# Patient Record
Sex: Male | Born: 1955 | Race: White | Hispanic: No | Marital: Married | Smoking: Former smoker
Health system: Southern US, Community
[De-identification: ages and names within clinical notes are randomized; demographics above are authoritative.]

## PROBLEM LIST (undated history)

## (undated) DIAGNOSIS — E785 Hyperlipidemia, unspecified: Secondary | ICD-10-CM

## (undated) DIAGNOSIS — M1990 Unspecified osteoarthritis, unspecified site: Secondary | ICD-10-CM

## (undated) DIAGNOSIS — D1391 Familial adenomatous polyposis: Secondary | ICD-10-CM

## (undated) DIAGNOSIS — G4730 Sleep apnea, unspecified: Secondary | ICD-10-CM

## (undated) DIAGNOSIS — D126 Benign neoplasm of colon, unspecified: Secondary | ICD-10-CM

## (undated) DIAGNOSIS — I5042 Chronic combined systolic (congestive) and diastolic (congestive) heart failure: Secondary | ICD-10-CM

## (undated) DIAGNOSIS — I480 Paroxysmal atrial fibrillation: Secondary | ICD-10-CM

## (undated) DIAGNOSIS — Z6841 Body Mass Index (BMI) 40.0 and over, adult: Secondary | ICD-10-CM

## (undated) DIAGNOSIS — D689 Coagulation defect, unspecified: Secondary | ICD-10-CM

## (undated) DIAGNOSIS — T7840XA Allergy, unspecified, initial encounter: Secondary | ICD-10-CM

## (undated) DIAGNOSIS — G4733 Obstructive sleep apnea (adult) (pediatric): Secondary | ICD-10-CM

## (undated) DIAGNOSIS — I499 Cardiac arrhythmia, unspecified: Secondary | ICD-10-CM

## (undated) DIAGNOSIS — I10 Essential (primary) hypertension: Secondary | ICD-10-CM

## (undated) HISTORY — PX: TOOTH EXTRACTION: SUR596

## (undated) HISTORY — DX: Benign neoplasm of colon, unspecified: D12.6

## (undated) HISTORY — PX: VASECTOMY: SHX75

## (undated) HISTORY — DX: Coagulation defect, unspecified: D68.9

## (undated) HISTORY — DX: Essential (primary) hypertension: I10

## (undated) HISTORY — DX: Unspecified osteoarthritis, unspecified site: M19.90

## (undated) HISTORY — DX: Chronic combined systolic (congestive) and diastolic (congestive) heart failure: I50.42

## (undated) HISTORY — PX: COLON SURGERY: SHX602

## (undated) HISTORY — PX: ROTATOR CUFF REPAIR: SHX139

## (undated) HISTORY — DX: Sleep apnea, unspecified: G47.30

## (undated) HISTORY — DX: Cardiac arrhythmia, unspecified: I49.9

## (undated) HISTORY — DX: Paroxysmal atrial fibrillation: I48.0

## (undated) HISTORY — DX: Allergy, unspecified, initial encounter: T78.40XA

## (undated) HISTORY — DX: Obstructive sleep apnea (adult) (pediatric): G47.33

## (undated) HISTORY — DX: Familial adenomatous polyposis: D13.91

---

## 2000-12-10 ENCOUNTER — Emergency Department (HOSPITAL_COMMUNITY): Admission: EM | Admit: 2000-12-10 | Discharge: 2000-12-10 | Payer: Self-pay | Admitting: Emergency Medicine

## 2000-12-10 ENCOUNTER — Encounter: Payer: Self-pay | Admitting: Emergency Medicine

## 2004-04-15 DIAGNOSIS — I10 Essential (primary) hypertension: Secondary | ICD-10-CM | POA: Insufficient documentation

## 2015-04-16 HISTORY — PX: CHEST TUBE INSERTION: SHX231

## 2015-07-11 DIAGNOSIS — E785 Hyperlipidemia, unspecified: Secondary | ICD-10-CM | POA: Insufficient documentation

## 2017-10-13 HISTORY — PX: COLONOSCOPY: SHX174

## 2018-02-13 ENCOUNTER — Ambulatory Visit (INDEPENDENT_AMBULATORY_CARE_PROVIDER_SITE_OTHER): Payer: BLUE CROSS/BLUE SHIELD | Admitting: Family Medicine

## 2018-02-13 ENCOUNTER — Encounter: Payer: Self-pay | Admitting: Family Medicine

## 2018-02-13 DIAGNOSIS — Z87892 Personal history of anaphylaxis: Secondary | ICD-10-CM | POA: Diagnosis not present

## 2018-02-13 DIAGNOSIS — I10 Essential (primary) hypertension: Secondary | ICD-10-CM

## 2018-02-13 DIAGNOSIS — M25569 Pain in unspecified knee: Secondary | ICD-10-CM | POA: Diagnosis not present

## 2018-02-13 DIAGNOSIS — E785 Hyperlipidemia, unspecified: Secondary | ICD-10-CM

## 2018-02-13 DIAGNOSIS — D126 Benign neoplasm of colon, unspecified: Secondary | ICD-10-CM | POA: Diagnosis not present

## 2018-02-13 MED ORDER — EPINEPHRINE 0.3 MG/0.3ML IJ SOAJ: 0.3000 mg | Freq: Once | INTRAMUSCULAR | 0 refills | Status: DC | PRN

## 2018-02-13 MED ORDER — LISINOPRIL 40 MG PO TABS: 40.0000 mg | ORAL_TABLET | Freq: Every day | ORAL | 1 refills | Status: DC

## 2018-02-13 MED ORDER — TRAMADOL HCL 50 MG PO TABS: 50.0000 mg | ORAL_TABLET | Freq: Four times a day (QID) | ORAL | 0 refills | Status: DC | PRN

## 2018-02-13 NOTE — Assessment & Plan Note (Signed)
BMI 44.3 today.  Discussed importance of losing weight and helping with his knee pain/arthritis as well as hypertension.  Patient voiced understanding.  Follow-up in 6 months.

## 2018-02-13 NOTE — Assessment & Plan Note (Signed)
Obtain records from previous PCP.  Reportedly not due for next colonoscopy for 5 years.

## 2018-02-13 NOTE — Assessment & Plan Note (Signed)
At goal.  Refilled lisinopril 40 mg daily.

## 2018-02-13 NOTE — Patient Instructions (Signed)
It was very nice to see you today!  I will refill your medications and send an EpiPen for you  You probably have mild arthritis in your knee.  You can take over-the-counter medication such as Tylenol, ibuprofen, and naproxen as needed.  Please continue using your compression sleeve.  You can also use ice to the area.  Come back to see me in about 6 months for your annual physical with blood work, or sooner as needed.  Take care, Dr Jerline Pain

## 2018-02-13 NOTE — Progress Notes (Signed)
Subjective:  Clayton Freeman is a 62 y.o. male who presents today with a chief complaint of HTN and to establish care.  HPI:  HTN Several year history. Stable. Currently on lisinopril 40mg  daily and tolerating well. No reported chest pain or shortness of breath.   Knee Pain Several month history. Located in right knee however is present in both knees.  Symptoms are stable.  Worse with certain activities.  He has tried compression brace which helps some.  Occasionally takes Tylenol and other over-the-counter's which helped.  Very infrequently uses tramadol for severe pain.  FAP Last had a colonoscopy few months ago.  Reportedly was cleared for 5 years.  No reported hematochezia or melena.  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  ROS: Per HPI, otherwise a complete review of systems was negative.   PMH:  The following were reviewed and entered/updated in epic: Past Medical History:  Diagnosis Date  . FAP (familial adenomatous polyposis)   . Hypertension    Patient Active Problem List   Diagnosis Date Noted  . Morbid obesity ([place]) 02/13/2018  . History of anaphylaxis 02/13/2018  . Knee pain 02/13/2018  . FAP (familial adenomatous polyposis)   . Dyslipidemia 07/11/2015  . Essential (primary) hypertension 04/15/2004   Past Surgical History:  Procedure Laterality Date  . ROTATOR CUFF REPAIR     Jan 2019    Family History  Problem Relation Age of Onset  . Hypertension Mother   . Colonic polyp Father   . Diverticulitis Father   . Hypertension Father   . Prostate cancer Father     Medications- reviewed and updated Current Outpatient Medications  Medication Sig Dispense Refill  . aspirin EC 81 MG tablet Take 81 mg by mouth daily.    Marland Kitchen lisinopril (PRINIVIL,ZESTRIL) 40 MG tablet Take 1 tablet (40 mg total) by mouth daily. 90 tablet 1  . Multiple Vitamin (MULTIVITAMIN) tablet Take 1 tablet by mouth daily.    . Omega-3 Fatty Acids (FISH OIL) 1000 MG  CAPS Take by mouth.    . traMADol (ULTRAM) 50 MG tablet Take 1 tablet (50 mg total) by mouth every 6 (six) hours as needed for moderate pain or severe pain. 1-2 every 4 hours as needed for pain 30 tablet 0  . VITAMIN E PO Take by mouth.    . EPINEPHrine 0.3 mg/0.3 mL IJ SOAJ injection Inject 0.3 mLs (0.3 mg total) into the muscle once as needed (anaphylaxis/allergic reaction). 2 Device 0   No current facility-administered medications for this visit.    Allergies-reviewed and updated Allergies  Allergen Reactions  . Bee Venom Anaphylaxis, Hives, Itching, Palpitations, Rash, Shortness Of Breath and Swelling  . Penicillins Anaphylaxis, Hives, Itching, Palpitations, Rash, Shortness Of Breath and Swelling  . Other     Horse Radish  . Erythromycin Hives, Rash and Swelling    Social History   Socioeconomic History  . Marital status: Married    Spouse name: Not on file  . Number of children: Not on file  . Years of education: Not on file  . Highest education level: Not on file  Occupational History  . Not on file  Social Needs  . Financial resource strain: Not on file  . Food insecurity:    Worry: Not on file    Inability: Not on file  . Transportation needs:    Medical: Not on file    Non-medical: Not on file  Tobacco Use  . Smoking status: Former  Smoker  . Smokeless tobacco: Never Used  . Tobacco comment: occ smoke pipe for 2 years  Substance and Sexual Activity  . Alcohol use: Yes    Comment: Very Rarely  . Drug use: Never  . Sexual activity: Not on file  Lifestyle  . Physical activity:    Days per week: Not on file    Minutes per session: Not on file  . Stress: Not on file  Relationships  . Social connections:    Talks on phone: Not on file    Gets together: Not on file    Attends religious service: Not on file    Active member of club or organization: Not on file    Attends meetings of clubs or organizations: Not on file    Relationship status: Not on file  Other  Topics Concern  . Not on file  Social History Narrative  . Not on file    Objective:  Physical Exam: BP 120/78 (BP Location: Left Arm, Patient Position: Sitting, Cuff Size: Large)   Pulse 78   Temp 98 F (36.7 C) (Oral)   Ht 6' (1.829 m)   Wt (!) 326 lb 12.8 oz (148.2 kg)   SpO2 96%   BMI 44.32 kg/m   Gen: NAD, resting comfortably CV: RRR with no murmurs appreciated Pulm: NWOB, CTAB with no crackles, wheezes, or rhonchi GI: Normal bowel sounds present. Soft, Nontender, Nondistended. MSK: -Right knee: Mild crepitus with active range of motion.  Stable to varus and valgus stress.  Posterior and anterior drawer signs negative. -Left knee: Mild crepitus with active range of motion.  Stable to varus and valgus stress.  Posterior and anterior drawer signs negative. Skin: Warm, dry Neuro: Grossly normal, moves all extremities Psych: Normal affect and thought content  Assessment/Plan:  Morbid obesity ([facility]) BMI 44.3 today.  Discussed importance of losing weight and helping with his knee pain/arthritis as well as hypertension.  Patient voiced understanding.  Follow-up in 6 months.  History of anaphylaxis EpiPen refilled.  Knee pain Consistent with osteoarthritis.  Recommended compression sleeve, ice, and over-the-counter analgesics as needed.  Database was reviewed without red flags.  Refilled his tramadol today as well.  Will obtain records from his previous PCP.  FAP (familial adenomatous polyposis) Obtain records from previous PCP.  Reportedly not due for next colonoscopy for 5 years.  Essential (primary) hypertension At goal.  Refilled lisinopril 40 mg daily.  Dyslipidemia Obtain records from previous PCP.  Check lipid panel with next blood draw.  Preventative healthcare Obtain records from previous PCP.  Reportedly up-to-date on vaccines and screenings.  Algis Greenhouse. Jerline Pain, MD 02/13/2018 12:34 PM

## 2018-02-13 NOTE — Assessment & Plan Note (Signed)
EpiPen refilled 

## 2018-02-13 NOTE — Assessment & Plan Note (Signed)
Consistent with osteoarthritis.  Recommended compression sleeve, ice, and over-the-counter analgesics as needed.  Database was reviewed without red flags.  Refilled his tramadol today as well.  Will obtain records from his previous PCP.

## 2018-02-13 NOTE — Assessment & Plan Note (Addendum)
Obtain records from previous PCP.  Check lipid panel with next blood draw.

## 2018-04-07 ENCOUNTER — Telehealth: Payer: BLUE CROSS/BLUE SHIELD | Admitting: Physician Assistant

## 2018-04-07 DIAGNOSIS — J00 Acute nasopharyngitis [common cold]: Secondary | ICD-10-CM

## 2018-04-07 MED ORDER — BENZONATATE 100 MG PO CAPS: 100.0000 mg | ORAL_CAPSULE | Freq: Three times a day (TID) | ORAL | 0 refills | Status: DC

## 2018-04-07 MED ORDER — IPRATROPIUM BROMIDE 0.06 % NA SOLN: 2.0000 | Freq: Four times a day (QID) | NASAL | 0 refills | Status: DC

## 2018-04-07 NOTE — Progress Notes (Signed)
We are sorry you are not feeling well.  Here is how we plan to help!  Based on what you have shared with me, it looks like you may have a viral upper respiratory infection or a "common cold".  Colds are caused by a large number of viruses; however, rhinovirus is the most common cause.   Symptoms of the common cold vary from person to person, with common symptoms including sore throat, cough, and malaise.  A low-grade fever of 100.4 may present, but is often uncommon.  Symptoms vary however, and are closely related to a person's age or underlying illnesses.  The most common symptoms associated with the common cold are nasal discharge or congestion, cough, sneezing, headache and pressure in the ears and face.  Cold symptoms usually persist for about 3 to 10 days, but can last up to 2 weeks.  It is important to know that colds do not cause serious illness or complications in most cases.    The common cold is transmitted from person to person, with the most common method of transmission being a person's hands.  The virus is able to live on the skin and can infect other persons for up to 2 hours after direct contact.  Also, colds are transmitted when someone coughs or sneezes; thus, it is important to cover the mouth to reduce this risk.  To keep the spread of the common cold at [place], good hand hygiene is very important.  This is an infection that is most likely caused by a virus. There are no specific treatments for the common cold other than to help you with the symptoms until the infection runs its course.    For nasal congestion, you may use an oral decongestants such as Mucinex D or if you have glaucoma or high blood pressure use plain Mucinex.  Saline nasal spray or nasal drops can help and can safely be used as often as needed for congestion.  For your congestion, I have prescribed Ipratropium Bromide nasal spray 0.03% two sprays in each nostril 2-3 times a day  If you do not have a history of heart  disease, hypertension, diabetes or thyroid disease, prostate/bladder issues or glaucoma, you may also use Sudafed to treat nasal congestion.  It is highly recommended that you consult with a pharmacist or your primary care physician to ensure this medication is safe for you to take.     If you have a cough, you may use cough suppressants such as Delsym and Robitussin.  If you have glaucoma or high blood pressure, you can also use Coricidin HBP.   For cough I have prescribed for you A prescription cough medication called Tessalon Perles 100 mg. You may take 1-2 capsules every 8 hours as needed for cough  If you have a sore or scratchy throat, use a saltwater gargle-  to  teaspoon of salt dissolved in a 4-ounce to 8-ounce glass of warm water.  Gargle the solution for approximately 15-30 seconds and then spit.  It is important not to swallow the solution.  You can also use throat lozenges/cough drops and Chloraseptic spray to help with throat pain or discomfort.  Warm or cold liquids can also be helpful in relieving throat pain.  For headache, pain or general discomfort, you can use Ibuprofen or Tylenol as directed.   Some authorities believe that zinc sprays or the use of Echinacea may shorten the course of your symptoms.   HOME CARE Only take medications as instructed  by your medical team. Be sure to drink plenty of fluids. Water is fine as well as fruit juices, sodas and electrolyte beverages. You may want to stay away from caffeine or alcohol. If you are nauseated, try taking small sips of liquids. How do you know if you are getting enough fluid? Your urine should be a pale yellow or almost colorless. Get rest. Taking a steamy shower or using a humidifier may help nasal congestion and ease sore throat pain. You can place a towel over your head and breathe in the steam from hot water coming from a faucet. Using a saline nasal spray works much the same way. Cough drops, hard candies and sore throat  lozenges may ease your cough. Avoid close contacts especially the very young and the elderly Cover your mouth if you cough or sneeze Always remember to wash your hands.   GET HELP RIGHT AWAY IF: You develop worsening fever. If your symptoms do not improve within 10 days You develop yellow or green discharge from your nose over 3 days. You have coughing fits You develop a severe head ache or visual changes. You develop shortness of breath, difficulty breathing or start having chest pain  Your symptoms persist after you have completed your treatment plan  MAKE SURE YOU  Understand these instructions. Will watch your condition. Will get help right away if you are not doing well or get worse.  Your e-visit answers were reviewed by a board certified advanced clinical practitioner to complete your personal care plan. Depending upon the condition, your plan could have included both over the counter or prescription medications. Please review your pharmacy choice. If there is a problem, you may call our nursing hot line at and have the prescription routed to another pharmacy. Your safety is important to [place]. If you have drug allergies check your prescription carefully.   You can use MyChart to ask questions about today's visit, request a non-urgent call back, or ask for a work or school excuse for 24 hours related to this e-Visit. If it has been greater than 24 hours you will need to follow up with your provider, or enter a new e-Visit to address those concerns. You will get an e-mail in the next two days asking about your experience.  I hope that your e-visit has been valuable and will speed your recovery. Thank you for using e-visits.      ===View-only below this line===   ----- Message -----    From: Carmin Richmond    Sent: 04/07/2018 12:00 PM EST      To: E-Visit Mailing List Subject: E-Visit Submission: Sinus Problems  E-Visit Submission: Sinus  Problems --------------------------------  Question: Which of the following have you been experiencing? Answer:   Congested nose            Headache            Throat pain            Cough  Question: Have these symptoms significantly worsened over the last two to three days? Answer:   Yes  Question: Describe your sore throat: Answer:   I usually start with bad sinus infection that then goes to throat and chest causing cough..  which is happening, usually does once or twice a year..The sinus are just irritating the throat  Question: How long have you had a sore throat? Answer:   1 day  Question: Do you have any tenderness or swelling in your neck? Answer:  No  Question: Have you had any of the following? Answer:   None of the above  Question: How long have you been having these symptoms? Answer:   3 days  Question: Do you have a fever? Answer:   I do not know  Question: Do you smoke? Answer:   No  Question: Have you ever smoked? Answer:   I smoked in the past, but not regularly  Question: Do you have any chronic illnesses, such as diabetes, heart disease, kidney disease, or lung disease, or any illness that would weaken your body's ability to fight infection? Answer:   No  Question: When you blow your nose, what color is the mucus? Answer:   Mostly thick and yellow or green  Question: Have you experienced similar problems in the past? Answer:   Yes  Question: What treatments have worked in the past?  Answer:   Usually my previous Dr.s (before moving to [place]) for similar cold / flu / bronc inf...  would prescribe             Keflex?, cephalexin?, Ceclor,  or last couple of years 2018  it was Levofloxacin  ~ 500 MG  Question: What treatment(s) in the past have been unsuccessful? Answer:   Noted in previous question            Keflex?,  keflexian?, Ceclor?,  or last couple of years 2018  it was Levofloxacin  ~ 500 MG (that was after my third visit with same symptoms in a  month...  Question: Is this illness similar to previous illnesses you have had?  How is it the same?  How is it different? Answer:   yes .. happens about yearly.. sometimes turns into flu, other times in respiratory system bronchitis or worse  Question: Have you recently been hospitalized? Answer:   No  Question: What medications are you currently taking for these symptoms? Answer:   Decongestants            Nose spray  Question: Please enter the names of any medications you are taking, or any other treatments you are trying. Answer:   zicam, vicks sinex severe... Coricidin HBP  Question: Please list your medication allergies that you may have ? (If 'none' , please list as 'none') Answer:   penicillin, erythromycin  Question: Please list any additional comments  Answer:

## 2018-04-27 ENCOUNTER — Other Ambulatory Visit: Payer: Self-pay | Admitting: Family Medicine

## 2018-04-28 NOTE — Telephone Encounter (Signed)
Please advise 

## 2018-05-15 ENCOUNTER — Ambulatory Visit: Payer: BLUE CROSS/BLUE SHIELD | Admitting: Family Medicine

## 2018-05-15 ENCOUNTER — Other Ambulatory Visit: Payer: Self-pay | Admitting: Family Medicine

## 2018-05-15 ENCOUNTER — Encounter: Payer: Self-pay | Admitting: Family Medicine

## 2018-05-15 VITALS — BP 124/76 | HR 92 | Temp 97.8°F | Ht 72.0 in | Wt 337.2 lb

## 2018-05-15 DIAGNOSIS — M25561 Pain in right knee: Secondary | ICD-10-CM | POA: Diagnosis not present

## 2018-05-15 DIAGNOSIS — G8929 Other chronic pain: Secondary | ICD-10-CM

## 2018-05-15 DIAGNOSIS — M25551 Pain in right hip: Secondary | ICD-10-CM | POA: Diagnosis not present

## 2018-05-15 MED ORDER — TRAMADOL HCL 50 MG PO TABS: 50.0000 mg | ORAL_TABLET | Freq: Four times a day (QID) | ORAL | 0 refills | Status: DC | PRN

## 2018-05-15 MED ORDER — PREDNISONE 50 MG PO TABS: ORAL_TABLET | ORAL | 0 refills | Status: DC

## 2018-05-15 NOTE — Progress Notes (Signed)
   Chief Complaint:  Clayton Freeman is a 63 y.o. male who presents for same day appointment with a chief complaint of knee pain.   Assessment/Plan:  Right knee pain Likely secondary to osteoarthritis.  He has an acute flare.  Will defer intra-articular injection today.  We will refill his tramadol.  Given his concurrent hip pain, we will start prednisone taper today.  Would avoid NSAIDs for the time being as we did not have a renal function test on file and he is currently on lisinopril.  Continue ice and compression.  Discussed reasons to return to care.  Will place referral to sports medicine.  Right hip pain Likely muscular strain in setting of right knee pain.  Will be treated with above prednisone.  Discussed home exercise program and handout was given.  Will place referral to sports medicine.  Discussed reasons to return to care.     Subjective:  HPI:  Knee Pain, acute problem Patient has history of chronic right knee pain.  Over the last 2 weeks he has noticed worsening pain and swelling to his right knee.  These symptoms have occurred gradually.  No obvious precipitating events.  No obvious injuries.  Occasionally feels a little instability in the joint as well.  He has additionally noticed some pain in his posterior right hip over the last couple of weeks as well.  He has tried tramadol which helps a little bit however symptoms seem to be progressing.  No weakness or numbness.  Pain is worse with most movements and with most positions.  It is very hard for him to get comfortable.  No other obvious alleviating or aggravating factors.   ROS: Per HPI  PMH: He reports that he has quit smoking. He has never used smokeless tobacco. He reports current alcohol use. He reports that he does not use drugs.      Objective:  Physical Exam: BP 124/76 (BP Location: Left Arm, Patient Position: Sitting, Cuff Size: Large)   Pulse 92   Temp 97.8 F (36.6 C) (Oral)   Ht 6' (1.829 m)   Wt (!) 337  lb 3.2 oz (153 kg)   SpO2 94%   BMI 45.73 kg/m   Gen: NAD, resting comfortably MSK -Right knee: Mild effusion noted.  Tender to palpation along joint line.  Anterior and posterior drawer signs negative.  McMurray negative.  Neurovascular intact distally. -Right hip: No deformities.  Tender palpation along posterior greater trochanter.  Tenderness elicited with resisted hip abduction and extension.     Algis Greenhouse. Jerline Pain, MD 05/15/2018 4:17 PM

## 2018-05-15 NOTE — Patient Instructions (Signed)
It was very nice to see you today!  Please start the prednisone.  I will refill your tramadol.  Please continue to use ice and compression to the areas.  Please come back to see Dr. Paulla Fore soon.  Take care, Dr Jerline Pain

## 2018-05-25 ENCOUNTER — Ambulatory Visit: Payer: Self-pay

## 2018-05-25 ENCOUNTER — Ambulatory Visit: Payer: BLUE CROSS/BLUE SHIELD | Admitting: Sports Medicine

## 2018-05-25 ENCOUNTER — Encounter: Payer: Self-pay | Admitting: Sports Medicine

## 2018-05-25 VITALS — BP 124/80 | HR 102 | Ht 72.0 in | Wt 336.0 lb

## 2018-05-25 DIAGNOSIS — M25551 Pain in right hip: Secondary | ICD-10-CM

## 2018-05-25 DIAGNOSIS — G8929 Other chronic pain: Secondary | ICD-10-CM

## 2018-05-25 DIAGNOSIS — M25561 Pain in right knee: Secondary | ICD-10-CM

## 2018-05-25 NOTE — Patient Instructions (Addendum)

## 2018-05-25 NOTE — Progress Notes (Signed)
Clayton Freeman. Clayton Freeman, [facility] - 63 y.o. male MRN [id number]  Date of birth: 1955-04-21  Visit Date: May 25, 2018  PCP: Vivi Barrack, MD   Referred by: Vivi Barrack, MD  SUBJECTIVE:  Chief Complaint  Patient presents with  . Right Hip - Initial Assessment  . Right Knee - Initial Assessment  . New Patient (Initial Visit)    R hip and knee pain.  Prednisone 50mg .    HPI: Patient is here for evaluation of right hip and knee pain that is been present for the past year after having a fall [place] a step.  He landed directly on his right knee at that time and has had progressive intermittent symptoms since then.  He does report occasional mechanical giving way.  It progressively worsened up to a 9 out of 10 pain.  He was having swelling numbness and weakness in the right lower extremity as well as pain that was radiating up into the thigh and into the gluteal region.  He was having pain that was waking him up at night due to this.  He was started on prednisone last week and tramadol with moderate improvement.  He has been using ice heat and a brace intermittently but is actually been able to discontinue the brace with the prednisone.  REVIEW OF SYSTEMS: Denies fevers, chills, recent weight gain or weight loss.  No night sweats.  Pt denies any change in bowel or bladder habits, muscle weakness, numbness or falls associated with this pain. Some lower extremity edema but this is mild. Otherwise 12 point review of systems performed and is negative    HISTORY:  Prior history reviewed and updated per electronic medical record.  Patient Active Problem List   Diagnosis Date Noted  . Morbid obesity ([place]) 02/13/2018  . History of anaphylaxis 02/13/2018  . Knee pain 02/13/2018  . FAP (familial adenomatous polyposis)   . Dyslipidemia 07/11/2015  . Essential  (primary) hypertension 04/15/2004   Social History   Occupational History  . Not on file  Tobacco Use  . Smoking status: Former Research scientist (life sciences)  . Smokeless tobacco: Never Used  . Tobacco comment: occ smoke pipe for 2 years  Substance and Sexual Activity  . Alcohol use: Yes    Comment: Very Rarely  . Drug use: Never  . Sexual activity: Not on file   Social History   Social History Narrative  . Not on file    Past Medical History:  Diagnosis Date  . FAP (familial adenomatous polyposis)   . Hypertension      Past Surgical History:  Procedure Laterality Date  . ROTATOR CUFF REPAIR     Jan 2019    family history includes Colonic polyp in his father; Diverticulitis in his father; Hypertension in his father and mother; Prostate cancer in his father. No results for input(s): HGBA1C, LABURIC, CREATININE, CALCIUM, MG, AST, ALT, CKTOTAL, TSH in the last 8760 hours.  OBJECTIVE:  VS:  HT:6' (182.9 cm)   WT:(!) 336 lb (152.4 kg)  BMI:45.56    BP:124/80  HR:(!) 102bpm  TEMP: ( )  RESP:94 %   PHYSICAL EXAM: Well-developed, Well-nourished and In no acute distress  Pupils are equal., EOM intact without nystagmus. and No scleral icterus.  Alert & appropriately interactive. and Not depressed or anxious appearing.   Warm and well perfused Pulses:  DP  Pulses: Bilaterally normal and symmetric PT Pulses: Bilaterally normal and symmetric Edema: No significant swelling or edema Calf supple with no pain with squeeze (Negative Homans)   Right knee: Overall well aligned.  He has a small supraphysiologic effusion.  He has slight laxity with anterior drawer testing bilaterally but this is symmetric.  Pain with McMurray's localizing to the medial joint line.  He is ligamentously stable to varus and valgus straining.  Extensor mechanism strength is intact.  He has minimal pain with Stinchfield testing.  Logroll test is negative.  Mild pain with terminal FADIR however this is within normal range.     ASSESSMENT:   1. Chronic pain of right knee   2. Pain of right hip joint     PROCEDURES:  US Guided Injection per procedure note    PROCEDURE NOTE: THERAPEUTIC EXERCISES (95188)   Discussed the foundation of treatment for this condition is physical therapy and/or daily (5-6 days/week) therapeutic exercises, focusing on core strengthening, coordination, neuromuscular control/reeducation. 15 minutes spent for Therapeutic exercises as below and as referenced in the AVS. This included exercises focusing on stretching, strengthening, with significant focus on eccentric aspects.  Proper technique shown and discussed handout in great detail with ATC. All questions were discussed and answered.   Long term goals include an improvement in range of motion, strength, endurance as well as avoiding reinjury. Frequency of visits is one time as determined during today's office visit. Frequency of exercises to be performed is as per handout.  EXERCISES REVIEWED: Hip ABduction strengthening with focus on Glute Medius Recruitment VMO Strengthening      PLAN:  Pertinent additional documentation may be included in corresponding procedure notes, imaging studies, problem based documentation and patient instructions.  No problem-specific Assessment & Plan notes found for this encounter.  We will go ahead and inject the knee per procedure note and have them begin on hip and knee strenghtening exersises. Additionally we discussed the merits of compression and/or bracing and recommend prophylatic compression with activity.  Icing discussed PRN.  If persistent ongoing symptoms can consider repeat injections and viscous supplementation.  Home Therapeutic exercises prescribed today per procedure note.  Activity modifications and the importance of avoiding exacerbating activities (limiting pain to no more than a 4 / 10 during or following activity) recommended and discussed.  Discussed red flag symptoms that  warrant earlier emergent evaluation and patient voices understanding.  Return in about 6 weeks (around 07/06/2018).          Gerda Diss, [facility] Physician

## 2018-05-25 NOTE — Procedures (Signed)
PROCEDURE NOTE:  Ultrasound Guided: Injection: Right knee Images were obtained and interpreted by myself, Teresa Coombs, DO  Images have been saved and stored to PACS system. Images obtained on: GE S7 Ultrasound machine    ULTRASOUND FINDINGS:  Degenerative bulging of the medial meniscus with a small supraphysiologic effusion and generalized synovitis.  DESCRIPTION OF PROCEDURE:  The patient's clinical condition is marked by substantial pain and/or significant functional disability. Other conservative therapy has not provided relief, is contraindicated, or not appropriate. There is a reasonable likelihood that injection will significantly improve the patient's pain and/or functional impairment.   After discussing the risks, benefits and expected outcomes of the injection and all questions were reviewed and answered, the patient wished to undergo the above named procedure.  Verbal consent was obtained.  The ultrasound was used to identify the target structure and adjacent neurovascular structures. The skin was then prepped in sterile fashion and the target structure was injected under direct visualization using sterile technique as below:  Single injection performed as below: PREP: Alcohol and Ethel Chloride APPROACH:superiolateral, single injection, 25g 1.5 in. INJECTATE: 2 cc 0.5% Marcaine and 2 cc 40mg /mL DepoMedrol ASPIRATE: None DRESSING: Band-Aid  Post procedural instructions including recommending icing and warning signs for infection were reviewed.    This procedure was well tolerated and there were no complications.   IMPRESSION: Succesful Ultrasound Guided: Injection

## 2018-06-22 DIAGNOSIS — S0501XA Injury of conjunctiva and corneal abrasion without foreign body, right eye, initial encounter: Secondary | ICD-10-CM | POA: Diagnosis not present

## 2018-07-08 ENCOUNTER — Ambulatory Visit: Payer: BLUE CROSS/BLUE SHIELD | Admitting: Sports Medicine

## 2018-08-09 ENCOUNTER — Other Ambulatory Visit: Payer: Self-pay | Admitting: Family Medicine

## 2018-09-23 ENCOUNTER — Encounter: Payer: Self-pay | Admitting: Sports Medicine

## 2018-09-29 ENCOUNTER — Encounter: Payer: Self-pay | Admitting: Family Medicine

## 2018-09-29 ENCOUNTER — Ambulatory Visit (INDEPENDENT_AMBULATORY_CARE_PROVIDER_SITE_OTHER): Payer: BC Managed Care – PPO | Admitting: Family Medicine

## 2018-09-29 ENCOUNTER — Other Ambulatory Visit: Payer: Self-pay

## 2018-09-29 DIAGNOSIS — M25561 Pain in right knee: Secondary | ICD-10-CM

## 2018-09-29 DIAGNOSIS — G8929 Other chronic pain: Secondary | ICD-10-CM

## 2018-09-29 NOTE — Patient Instructions (Addendum)
Voltaren gel 1% apply to the 2 times daily  Ice 20 minutes 2 times daily. Usually after activity and before bed. Vitamin D 2000 Iu daily  Turmeric 500mg  daily  Tart cherry extract 1200mg  at night  Consider compression sleeve  [place]   330pm on July 1st  July

## 2018-09-29 NOTE — Progress Notes (Signed)
Virtual Visit via Video Note  I connected with Clayton Freeman on 09/29/18 at  4:30 PM EDT by a video enabled telemedicine application and verified that I am speaking with the correct person using two identifiers.  Location: Patient: in home setting Provider: in office setting    I discussed the limitations of evaluation and management by telemedicine and the availability of in person appointments. The patient expressed understanding and agreed to proceed.  History of Present Illness: Patient is a pleasant 63 year old male who was seen by another provider in February and given an injection in the left knee.  Was doing significantly better but now the pain is starting to come back.  Mild instability has been using a brace for.  Patient states that at the end of a long day he does have more of a dull, throbbing aching sensation.  Patient has not been doing anything else on a regular basis at the moment.  Patient denies any falls or any new injuries    Observations/Objective: Appears alert and oriented.  Very pleasant to talk to over the virtual platform   Assessment and Plan: Left knee pain.  Per other providers there is a chance for osteoarthritic changes.  Discussed with patient I would like to see dyspnea and person in the next 2 weeks.  We discussed over-the-counter topical anti-inflammatories, vitamin D, as well as natural supplementations that I think will be helpful.  Encourage patient to use a compression sleeve on a daily basis.  Will follow-up with me in office in 2 to 3 weeks   Follow Up Instructions: 2 to 3 weeks in office with future considerations for formal physical therapy, steroid injections, and to discuss other treatment options and help slow down the progression of arthritis    I discussed the assessment and treatment plan with the patient. The patient was provided an opportunity to ask questions and all were answered. The patient agreed with the plan and demonstrated an  understanding of the instructions.   The patient was advised to call back or seek an in-person evaluation if the symptoms worsen or if the condition fails to improve as anticipated.  I provided 26 minutes of face-to-face time during this encounter.   Lyndal Pulley, DO

## 2018-10-13 ENCOUNTER — Ambulatory Visit: Payer: BC Managed Care – PPO | Admitting: Family Medicine

## 2018-10-13 ENCOUNTER — Encounter: Payer: Self-pay | Admitting: Family Medicine

## 2018-10-13 ENCOUNTER — Ambulatory Visit (INDEPENDENT_AMBULATORY_CARE_PROVIDER_SITE_OTHER)
Admission: RE | Admit: 2018-10-13 | Discharge: 2018-10-13 | Disposition: A | Discharge status: Home / Self Care | Payer: BC Managed Care – PPO | Source: Ambulatory Visit | Attending: Family Medicine | Admitting: Family Medicine

## 2018-10-13 ENCOUNTER — Other Ambulatory Visit: Payer: Self-pay

## 2018-10-13 VITALS — BP 124/82 | HR 87 | Ht 72.0 in

## 2018-10-13 DIAGNOSIS — G8929 Other chronic pain: Secondary | ICD-10-CM

## 2018-10-13 DIAGNOSIS — M1711 Unilateral primary osteoarthritis, right knee: Secondary | ICD-10-CM | POA: Insufficient documentation

## 2018-10-13 DIAGNOSIS — M25561 Pain in right knee: Secondary | ICD-10-CM

## 2018-10-13 DIAGNOSIS — M170 Bilateral primary osteoarthritis of knee: Secondary | ICD-10-CM | POA: Diagnosis not present

## 2018-10-13 NOTE — Assessment & Plan Note (Signed)
Patellofemoral arthritis.  True pull lite brace given.  Discussed icing regimen and home exercise.  Mild to moderate abnormal thigh to calf ratio and if the other brace does not work a custom brace will be necessary.  We discussed formal physical therapy and how that could be beneficial as well.  Patient would like to hold on that secondary to the coronavirus outbreak.  Discussed icing regimen, topical anti-inflammatories and vitamin supplementations.  X-rays pending.  Follow-up again 4 to 8 weeks

## 2018-10-13 NOTE — Patient Instructions (Signed)
Good to see you  Try the brace, if it does not work we will get a custom one done for you  Ice 20 minutes 2 times daily. Usually after activity and before bed. Exercises 3 times a week.  pennsaid pinkie amount topically 2 times daily as needed.  BUT try the voltaren gel 2 times daily  Continue the vitamins See me again in 4 weeks to make sure we are making progress.

## 2018-10-13 NOTE — Progress Notes (Signed)
[facility] [address], [place] [address] Phone: [phone] Subjective:   Clayton Freeman, am serving as a scribe for Dr. Hulan Saas.    CC: Right knee pain  NIO:[id number]  Clayton Freeman is a 63 y.o. male coming in with complaint of right knee pain. Seen virtually on 09/29/2018. Patient states he has had pain for 3 weeks. Had injection in February from Dr. Paulla Fore. He had relief for a few months. Pain is increasing due to doing more activities outside of the house.  Patient states that the pain seems to be more anterior to the knee.  Sometimes feels like the knee wants to hyperextend.  Denies any true swelling but sometimes feels like it looks bigger than the contralateral side.  Past Medical History:  Diagnosis Date  . FAP (familial adenomatous polyposis)   . Hypertension    Past Surgical History:  Procedure Laterality Date  . ROTATOR CUFF REPAIR     Jan 2019   Social History   Socioeconomic History  . Marital status: Married    Spouse name: Not on file  . Number of children: Not on file  . Years of education: Not on file  . Highest education level: Not on file  Occupational History  . Not on file  Social Needs  . Financial resource strain: Not on file  . Food insecurity    Worry: Not on file    Inability: Not on file  . Transportation needs    Medical: Not on file    Non-medical: Not on file  Tobacco Use  . Smoking status: Former Research scientist (life sciences)  . Smokeless tobacco: Never Used  . Tobacco comment: occ smoke pipe for 2 years  Substance and Sexual Activity  . Alcohol use: Yes    Comment: Very Rarely  . Drug use: Never  . Sexual activity: Not on file  Lifestyle  . Physical activity    Days per week: Not on file    Minutes per session: Not on file  . Stress: Not on file  Relationships  . Social Herbalist on phone: Not on file    Gets together: Not on file    Attends religious service: Not on file    Active member  of club or organization: Not on file    Attends meetings of clubs or organizations: Not on file    Relationship status: Not on file  Other Topics Concern  . Not on file  Social History Narrative  . Not on file   Allergies  Allergen Reactions  . Bee Venom Anaphylaxis, Hives, Itching, Palpitations, Rash, Shortness Of Breath and Swelling  . Penicillins Anaphylaxis, Hives, Itching, Palpitations, Rash, Shortness Of Breath and Swelling  . Other     Horse Radish  . Erythromycin Hives, Rash and Swelling   Family History  Problem Relation Age of Onset  . Hypertension Mother   . Colonic polyp Father   . Diverticulitis Father   . Hypertension Father   . Prostate cancer Father     Current Outpatient Medications (Endocrine & Metabolic):  .  predniSONE (DELTASONE) 50 MG tablet, Take 1 tablet daily for 5 days. Then take 1/2 tablet daily for 2 days.  Current Outpatient Medications (Cardiovascular):  Marland Kitchen  EPINEPHrine 0.3 mg/0.3 mL IJ SOAJ injection, Inject 0.3 mLs (0.3 mg total) into the muscle once as needed (anaphylaxis/allergic reaction). Marland Kitchen  lisinopril (ZESTRIL) 40 MG tablet, TAKE 1 TABLET BY MOUTH EVERY DAY  Current Outpatient Medications (Analgesics):  .  aspirin EC 81 MG tablet, Take 81 mg by mouth daily. .  traMADol (ULTRAM) 50 MG tablet, Take 1-2 tablets (50-100 mg total) by mouth every 6 (six) hours as needed.   Current Outpatient Medications (Other):  Marland Kitchen  Multiple Vitamin (MULTIVITAMIN) tablet, Take 1 tablet by mouth daily. .  Omega-3 Fatty Acids (FISH OIL) 1000 MG CAPS, Take by mouth. Marland Kitchen  VITAMIN E PO, Take by mouth.    Past medical history, social, surgical and family history all reviewed in electronic medical record.  Freeman pertanent information unless stated regarding to the chief complaint.   Review of Systems:  Freeman headache, visual changes, nausea, vomiting, diarrhea, constipation, dizziness, abdominal pain, skin rash, fevers, chills, night sweats, weight loss, swollen lymph  nodes, body aches,, chest pain, shortness of breath, mood changes.  Positive muscle aches, joint swelling  Objective  Blood pressure 124/82, pulse 87, height 6' (1.829 m), SpO2 97 %.   General: Freeman apparent distress alert and oriented x3 mood and affect normal, dressed appropriately.  HEENT: Pupils equal, extraocular movements intact  Respiratory: Patient's speak in full sentences and does not appear short of breath  Cardiovascular: Freeman lower extremity edema, non tender, Freeman erythema  Skin: Warm dry intact with Freeman signs of infection or rash on extremities or on axial skeleton.  Abdomen: Soft nontender  Neuro: Cranial nerves II through XII are intact, neurovascularly intact in all extremities with 2+ DTRs and 2+ pulses.  Lymph: Freeman lymphadenopathy of posterior or anterior cervical chain or axillae bilaterally.  Gait normal with good balance and coordination.  MSK:  Non tender with full range of motion and good stability and symmetric strength and tone of shoulders, elbows, wrist, hip, and ankles bilaterally.   Right knee exam does have some lateral translation of the patella noted at baseline.  Significant crepitus.  Positive apprehension test.  Patient does have a positive patella grind test.  Minimal tenderness to palpation over the medial lateral joint space.  Does have swelling of fat pad but does not seem irritated to palpation.  Contralateral knee unremarkable.   [address]; 15 additional minutes spent for Therapeutic exercises as stated in above notes.  This included exercises focusing on stretching, strengthening, with significant focus on eccentric aspects.   Long term goals include an improvement in range of motion, strength, endurance as well as avoiding reinjury. Patient's frequency would include in 1-2 times a day, 3-5 times a week for a duration of 6-12 weeks.Reviewed anatomy using anatomical model and how PFS occurs.  Given rehab exercises handout for VMO, hip abductors, core, entire kinetic  chain including proprioception exercises.  Could benefit from PT, regular exercise, upright biking, and a PFS knee brace to assist with tracking abnormalities.   Proper technique shown and discussed handout in great detail with ATC.  All questions were discussed and answered.     Impression and Recommendations:     This case required medical decision making of moderate complexity. The above documentation has been reviewed and is accurate and complete Lyndal Pulley, DO       Note: This dictation was prepared with Dragon dictation along with smaller phrase technology. Any transcriptional errors that result from this process are unintentional.

## 2018-10-14 ENCOUNTER — Ambulatory Visit: Payer: BC Managed Care – PPO | Admitting: Family Medicine

## 2018-11-11 ENCOUNTER — Encounter: Payer: Self-pay | Admitting: Family Medicine

## 2018-11-11 ENCOUNTER — Other Ambulatory Visit: Payer: Self-pay

## 2018-11-11 ENCOUNTER — Ambulatory Visit: Payer: BC Managed Care – PPO | Admitting: Family Medicine

## 2018-11-11 DIAGNOSIS — M1711 Unilateral primary osteoarthritis, right knee: Secondary | ICD-10-CM | POA: Diagnosis not present

## 2018-11-11 NOTE — Patient Instructions (Signed)
Continue with vitamins Wear brace with activity [phone] See me when you need me

## 2018-11-11 NOTE — Progress Notes (Signed)
[facility] [address] [place], [place] [address] Phone: [phone] Subjective:   Clayton Freeman, am serving as a scribe for Dr. Hulan Saas.   CC: Right knee pain follow-up  ZDG:[id number]   10/13/2018: Patellofemoral arthritis.  True pull lite brace given.  Discussed icing regimen and home exercise.  Mild to moderate abnormal thigh to calf ratio and if the other brace does not work a custom brace will be necessary.  We discussed formal physical therapy and how that could be beneficial as well.  Patient would like to hold on that secondary to the coronavirus outbreak.  Discussed icing regimen, topical anti-inflammatories and vitamin supplementations.  X-rays pending.  Follow-up again 4 to 8 weeks  Update 11/11/2018: Clayton Freeman is a 63 y.o. male coming in with complaint of right knee pain. Patient states that his knee pain is better. Pain is intermittent. If he does not take tart cherry at night he can notice an increase in his pain. Is wearing Tru-pull lite.  Patient would state 80% better overall.  Not wearing the brace on a regular basis anymore because he feels like he is making some improvement.    Past Medical History:  Diagnosis Date  . FAP (familial adenomatous polyposis)   . Hypertension    Past Surgical History:  Procedure Laterality Date  . ROTATOR CUFF REPAIR     Jan 2019   Social History   Socioeconomic History  . Marital status: Married    Spouse name: Not on file  . Number of children: Not on file  . Years of education: Not on file  . Highest education level: Not on file  Occupational History  . Not on file  Social Needs  . Financial resource strain: Not on file  . Food insecurity    Worry: Not on file    Inability: Not on file  . Transportation needs    Medical: Not on file    Non-medical: Not on file  Tobacco Use  . Smoking status: Former Research scientist (life sciences)  . Smokeless tobacco: Never Used  . Tobacco comment: occ smoke pipe  for 2 years  Substance and Sexual Activity  . Alcohol use: Yes    Comment: Very Rarely  . Drug use: Never  . Sexual activity: Not on file  Lifestyle  . Physical activity    Days per week: Not on file    Minutes per session: Not on file  . Stress: Not on file  Relationships  . Social Herbalist on phone: Not on file    Gets together: Not on file    Attends religious service: Not on file    Active member of club or organization: Not on file    Attends meetings of clubs or organizations: Not on file    Relationship status: Not on file  Other Topics Concern  . Not on file  Social History Narrative  . Not on file   Allergies  Allergen Reactions  . Bee Venom Anaphylaxis, Hives, Itching, Palpitations, Rash, Shortness Of Breath and Swelling  . Penicillins Anaphylaxis, Hives, Itching, Palpitations, Rash, Shortness Of Breath and Swelling  . Other     Horse Radish  . Erythromycin Hives, Rash and Swelling   Family History  Problem Relation Age of Onset  . Hypertension Mother   . Colonic polyp Father   . Diverticulitis Father   . Hypertension Father   . Prostate cancer Father     Current Outpatient  Medications (Endocrine & Metabolic):  .  predniSONE (DELTASONE) 50 MG tablet, Take 1 tablet daily for 5 days. Then take 1/2 tablet daily for 2 days.  Current Outpatient Medications (Cardiovascular):  Marland Kitchen  EPINEPHrine 0.3 mg/0.3 mL IJ SOAJ injection, Inject 0.3 mLs (0.3 mg total) into the muscle once as needed (anaphylaxis/allergic reaction). Marland Kitchen  lisinopril (ZESTRIL) 40 MG tablet, TAKE 1 TABLET BY MOUTH EVERY DAY   Current Outpatient Medications (Analgesics):  .  aspirin EC 81 MG tablet, Take 81 mg by mouth daily. .  traMADol (ULTRAM) 50 MG tablet, Take 1-2 tablets (50-100 mg total) by mouth every 6 (six) hours as needed.   Current Outpatient Medications (Other):  Marland Kitchen  Multiple Vitamin (MULTIVITAMIN) tablet, Take 1 tablet by mouth daily. .  Omega-3 Fatty Acids (FISH OIL) 1000  MG CAPS, Take by mouth. Marland Kitchen  VITAMIN E PO, Take by mouth.    Past medical history, social, surgical and family history all reviewed in electronic medical record.  Freeman pertanent information unless stated regarding to the chief complaint.   Review of Systems:  Freeman headache, visual changes, nausea, vomiting, diarrhea, constipation, dizziness, abdominal pain, skin rash, fevers, chills, night sweats, weight loss, swollen lymph nodes, body aches, joint swelling,  chest pain, shortness of breath, mood changes.  Mild positive muscle aches  Objective  Blood pressure 118/72, pulse 89, height 6' (1.829 m), weight (!) 336 lb (152.4 kg), SpO2 96 %.    General: Freeman apparent distress alert and oriented x3 mood and affect normal, dressed appropriately.  HEENT: Pupils equal, extraocular movements intact  Respiratory: Patient's speak in full sentences and does not appear short of breath  Cardiovascular: Freeman lower extremity edema, non tender, Freeman erythema  Skin: Warm dry intact with Freeman signs of infection or rash on extremities or on axial skeleton.  Abdomen: Soft nontender  Neuro: Cranial nerves II through XII are intact, neurovascularly intact in all extremities with 2+ DTRs and 2+ pulses.  Lymph: Freeman lymphadenopathy of posterior or anterior cervical chain or axillae bilaterally.  Gait normal with good balance and coordination.  MSK:  Non tender with full range of motion and good stability and symmetric strength and tone of shoulders, elbows, wrist, hip and ankles bilaterally.  Patient's right knee exam still has some crepitus noted.  Still some mild lateral tracking of the patella noted.  Some mild tenderness over the medial joint line.  Patient is doing better with range of motion and Freeman effusion noted.  Contralateral knee unremarkable.    Impression and Recommendations:     \ The above documentation has been reviewed and is accurate and complete Lyndal Pulley, DO       Note: This dictation was  prepared with Dragon dictation along with smaller phrase technology. Any transcriptional errors that result from this process are unintentional.

## 2018-11-11 NOTE — Assessment & Plan Note (Signed)
Patient is doing very well with conservative therapy at this time.  We will not have patient to increase activity.  Discussed icing regimen and home exercises.  Course of pain.  We discussed the importance of weight loss and decreasing the wear of the brace over the course of time.  Follow-up again in 4 to 8 weeks

## 2019-02-10 ENCOUNTER — Other Ambulatory Visit: Payer: Self-pay | Admitting: Family Medicine

## 2019-08-07 ENCOUNTER — Other Ambulatory Visit: Payer: Self-pay | Admitting: Family Medicine

## 2019-12-02 DIAGNOSIS — Z20822 Contact with and (suspected) exposure to covid-19: Secondary | ICD-10-CM | POA: Diagnosis not present

## 2019-12-14 DIAGNOSIS — Z20822 Contact with and (suspected) exposure to covid-19: Secondary | ICD-10-CM | POA: Diagnosis not present

## 2020-02-05 ENCOUNTER — Other Ambulatory Visit: Payer: Self-pay | Admitting: Family Medicine

## 2020-05-11 ENCOUNTER — Other Ambulatory Visit: Payer: Self-pay | Admitting: Family Medicine

## 2020-05-11 NOTE — Telephone Encounter (Signed)
Patient aware last OV 05/2018 Need OV for refills, will send refills for this month only till next appt

## 2020-05-26 ENCOUNTER — Ambulatory Visit: Payer: BC Managed Care – PPO | Admitting: Family Medicine

## 2020-05-26 ENCOUNTER — Encounter: Payer: Self-pay | Admitting: Family Medicine

## 2020-05-26 ENCOUNTER — Other Ambulatory Visit: Payer: Self-pay

## 2020-05-26 VITALS — BP 117/76 | HR 74 | Temp 98.2°F | Ht 72.0 in | Wt 333.6 lb

## 2020-05-26 DIAGNOSIS — Z87438 Personal history of other diseases of male genital organs: Secondary | ICD-10-CM | POA: Diagnosis not present

## 2020-05-26 DIAGNOSIS — R739 Hyperglycemia, unspecified: Secondary | ICD-10-CM

## 2020-05-26 DIAGNOSIS — Z125 Encounter for screening for malignant neoplasm of prostate: Secondary | ICD-10-CM

## 2020-05-26 DIAGNOSIS — I10 Essential (primary) hypertension: Secondary | ICD-10-CM | POA: Diagnosis not present

## 2020-05-26 DIAGNOSIS — E785 Hyperlipidemia, unspecified: Secondary | ICD-10-CM | POA: Diagnosis not present

## 2020-05-26 LAB — COMPREHENSIVE METABOLIC PANEL
ALT: 24 U/L (ref 0–53)
AST: 13 U/L (ref 0–37)
Albumin: 4.1 g/dL (ref 3.5–5.2)
Alkaline Phosphatase: 64 U/L (ref 39–117)
BUN: 8 mg/dL (ref 6–23)
CO2: 32 mEq/L (ref 19–32)
Calcium: 9.7 mg/dL (ref 8.4–10.5)
Chloride: 102 mEq/L (ref 96–112)
Creatinine, Ser: 0.75 mg/dL (ref 0.40–1.50)
GFR: 96.05 mL/min (ref >60.00)
Glucose, Bld: 113 mg/dL — ABNORMAL HIGH (ref 70–99)
Potassium: 4.5 mEq/L (ref 3.5–5.1)
Sodium: 139 mEq/L (ref 135–145)
Total Bilirubin: 0.6 mg/dL (ref 0.2–1.2)
Total Protein: 6.9 g/dL (ref 6.0–8.3)

## 2020-05-26 LAB — TSH: TSH: 2.72 u[IU]/mL (ref 0.35–4.50)

## 2020-05-26 LAB — LIPID PANEL
Cholesterol: 144 mg/dL (ref 0–200)
HDL: 30.6 mg/dL — ABNORMAL LOW (ref >39.00)
LDL Cholesterol: 84 mg/dL (ref 0–99)
NonHDL: 113.61
Total CHOL/HDL Ratio: 5
Triglycerides: 148 mg/dL (ref 0.0–149.0)
VLDL: 29.6 mg/dL (ref 0.0–40.0)

## 2020-05-26 LAB — CBC
HCT: 44.2 % (ref 39.0–52.0)
Hemoglobin: 15.1 g/dL (ref 13.0–17.0)
MCHC: 34.2 g/dL (ref 30.0–36.0)
MCV: 92.4 fl (ref 78.0–100.0)
Platelets: 165 10*3/uL (ref 150.0–400.0)
RBC: 4.78 Mil/uL (ref 4.22–5.81)
RDW: 13.6 % (ref 11.5–15.5)
WBC: 8.7 10*3/uL (ref 4.0–10.5)

## 2020-05-26 LAB — HEMOGLOBIN A1C: Hgb A1c MFr Bld: 5.4 % (ref 4.6–6.5)

## 2020-05-26 LAB — PSA: PSA: 1.41 ng/mL (ref 0.10–4.00)

## 2020-05-26 MED ORDER — LISINOPRIL 40 MG PO TABS: 40.0000 mg | ORAL_TABLET | Freq: Every day | ORAL | 3 refills | Status: DC

## 2020-05-26 NOTE — Patient Instructions (Signed)
It was very nice to see you today!  Your blood pressure looks good today.  We will check blood work.  I will also place a referral for you to see the urologist.  I would like to see you back in about a year or so for your annual checkup with blood work.  Please come back to see me sooner if needed.  Take care, Dr Jerline Pain  Please try these tips to maintain a healthy lifestyle:   Eat at least 3 REAL meals and 1-2 snacks per day.  Aim for no more than 5 hours between eating.  If you eat breakfast, please do so within one hour of getting up.    Each meal should contain half fruits/vegetables, one quarter protein, and one quarter carbs (no bigger than a computer mouse)   Cut down on sweet beverages. This includes juice, soda, and sweet tea.     Drink at least 1 glass of water with each meal and aim for at least 8 glasses per day   Exercise at least 150 minutes every week.

## 2020-05-26 NOTE — Assessment & Plan Note (Signed)
At goal.  Continue lisinopril 40 mg daily.  Check labs today.

## 2020-05-26 NOTE — Assessment & Plan Note (Signed)
Check lipids 

## 2020-05-26 NOTE — Progress Notes (Signed)
   Clayton Freeman is a 65 y.o. male who presents today for an office visit.  Assessment/Plan:  Chronic Problems Addressed Today: Morbid obesity ([place]) Weight down 3 lbs since last visit.   Essential (primary) hypertension At goal.  Continue lisinopril 40 mg daily.  Check labs today.  Dyslipidemia Check lipids.  Preventative health care Check labs.  Up-to-date on vaccines.    Subjective:  HPI:  See A/p.         Objective:  Physical Exam: BP 117/76   Pulse 74   Temp 98.2 F (36.8 C) (Temporal)   Ht 6' (1.829 m)   Wt (!) 333 lb 9.6 oz (151.3 kg)   SpO2 94%   BMI 45.24 kg/m   Wt Readings from Last 3 Encounters:  05/26/20 (!) 333 lb 9.6 oz (151.3 kg)  11/11/18 (!) 336 lb (152.4 kg)  05/25/18 (!) 336 lb (152.4 kg)  Gen: No acute distress, resting comfortably CV: Regular rate and rhythm with no murmurs appreciated Pulm: Normal work of breathing, clear to auscultation bilaterally with no crackles, wheezes, or rhonchi Neuro: Grossly normal, moves all extremities Psych: Normal affect and thought content      Clayton Freeman M. Jerline Pain, MD 05/26/2020 8:26 AM

## 2020-05-26 NOTE — Assessment & Plan Note (Signed)
Weight down 3 lbs since last visit.

## 2020-05-29 ENCOUNTER — Encounter: Payer: Self-pay | Admitting: Family Medicine

## 2020-05-29 DIAGNOSIS — R739 Hyperglycemia, unspecified: Secondary | ICD-10-CM | POA: Insufficient documentation

## 2020-05-29 NOTE — Progress Notes (Signed)
Please inform patient of the following:  Labs are all stable. Would like for him to keep working on diet and exercise and we can recheck in a year.  Algis Greenhouse. Jerline Pain, MD 05/29/2020 3:55 PM

## 2020-11-10 ENCOUNTER — Telehealth: Payer: BC Managed Care – PPO | Admitting: Emergency Medicine

## 2020-11-10 ENCOUNTER — Other Ambulatory Visit: Payer: Self-pay

## 2020-11-10 ENCOUNTER — Observation Stay (HOSPITAL_BASED_OUTPATIENT_CLINIC_OR_DEPARTMENT_OTHER)
Admission: EM | Admit: 2020-11-10 | Discharge: 2020-11-12 | Disposition: A | Discharge status: Home / Self Care | Payer: BC Managed Care – PPO | Attending: Family Medicine | Admitting: Family Medicine

## 2020-11-10 ENCOUNTER — Emergency Department (HOSPITAL_BASED_OUTPATIENT_CLINIC_OR_DEPARTMENT_OTHER): Payer: BC Managed Care – PPO | Admitting: Radiology

## 2020-11-10 ENCOUNTER — Encounter (HOSPITAL_BASED_OUTPATIENT_CLINIC_OR_DEPARTMENT_OTHER): Payer: Self-pay | Admitting: Emergency Medicine

## 2020-11-10 DIAGNOSIS — Z20822 Contact with and (suspected) exposure to covid-19: Secondary | ICD-10-CM | POA: Diagnosis not present

## 2020-11-10 DIAGNOSIS — Z87891 Personal history of nicotine dependence: Secondary | ICD-10-CM | POA: Diagnosis not present

## 2020-11-10 DIAGNOSIS — I480 Paroxysmal atrial fibrillation: Secondary | ICD-10-CM | POA: Diagnosis not present

## 2020-11-10 DIAGNOSIS — I10 Essential (primary) hypertension: Secondary | ICD-10-CM | POA: Diagnosis not present

## 2020-11-10 DIAGNOSIS — J189 Pneumonia, unspecified organism: Secondary | ICD-10-CM | POA: Diagnosis not present

## 2020-11-10 DIAGNOSIS — Z79899 Other long term (current) drug therapy: Secondary | ICD-10-CM | POA: Diagnosis not present

## 2020-11-10 DIAGNOSIS — R0602 Shortness of breath: Secondary | ICD-10-CM

## 2020-11-10 DIAGNOSIS — I4891 Unspecified atrial fibrillation: Secondary | ICD-10-CM | POA: Diagnosis present

## 2020-11-10 DIAGNOSIS — Z7982 Long term (current) use of aspirin: Secondary | ICD-10-CM | POA: Diagnosis not present

## 2020-11-10 DIAGNOSIS — R059 Cough, unspecified: Secondary | ICD-10-CM | POA: Diagnosis not present

## 2020-11-10 DIAGNOSIS — R6883 Chills (without fever): Secondary | ICD-10-CM | POA: Diagnosis not present

## 2020-11-10 HISTORY — DX: Hyperlipidemia, unspecified: E78.5

## 2020-11-10 HISTORY — DX: Morbid (severe) obesity due to excess calories: E66.01

## 2020-11-10 HISTORY — DX: Body Mass Index (BMI) 40.0 and over, adult: Z684

## 2020-11-10 LAB — CBC WITH DIFFERENTIAL/PLATELET
Abs Immature Granulocytes: 0.04 10*3/uL (ref 0.00–0.07)
Abs Immature Granulocytes: 0.03 10*3/uL (ref 0.00–0.07)
Basophils Absolute: 0 10*3/uL (ref 0.0–0.1)
Basophils Absolute: 0 10*3/uL (ref 0.0–0.1)
Basophils Relative: 0 %
Basophils Relative: 0 %
Eosinophils Absolute: 0.1 10*3/uL (ref 0.0–0.5)
Eosinophils Absolute: 0 10*3/uL (ref 0.0–0.5)
Eosinophils Relative: 0 %
Eosinophils Relative: 0 %
HCT: 39.6 % (ref 39.0–52.0)
HCT: 37.5 % — ABNORMAL LOW (ref 39.0–52.0)
Hemoglobin: 13.3 g/dL (ref 13.0–17.0)
Hemoglobin: 12.8 g/dL — ABNORMAL LOW (ref 13.0–17.0)
Immature Granulocytes: 0 %
Immature Granulocytes: 0 %
Lymphocytes Relative: 12 %
Lymphocytes Relative: 12 %
Lymphs Abs: 1.5 10*3/uL (ref 0.7–4.0)
Lymphs Abs: 1.4 10*3/uL (ref 0.7–4.0)
MCH: 31.8 pg (ref 26.0–34.0)
MCH: 32 pg (ref 26.0–34.0)
MCHC: 33.6 g/dL (ref 30.0–36.0)
MCHC: 34.1 g/dL (ref 30.0–36.0)
MCV: 94.7 fL (ref 80.0–100.0)
MCV: 93.8 fL (ref 80.0–100.0)
Monocytes Absolute: 1.2 10*3/uL — ABNORMAL HIGH (ref 0.1–1.0)
Monocytes Absolute: 1.2 10*3/uL — ABNORMAL HIGH (ref 0.1–1.0)
Monocytes Relative: 10 %
Monocytes Relative: 10 %
Neutro Abs: 9.5 10*3/uL — ABNORMAL HIGH (ref 1.7–7.7)
Neutro Abs: 9.4 10*3/uL — ABNORMAL HIGH (ref 1.7–7.7)
Neutrophils Relative %: 78 %
Neutrophils Relative %: 78 %
Platelets: 192 10*3/uL (ref 150–400)
Platelets: 196 10*3/uL (ref 150–400)
RBC: 4.18 MIL/uL — ABNORMAL LOW (ref 4.22–5.81)
RBC: 4 MIL/uL — ABNORMAL LOW (ref 4.22–5.81)
RDW: 12.9 % (ref 11.5–15.5)
RDW: 12.8 % (ref 11.5–15.5)
WBC: 12.3 10*3/uL — ABNORMAL HIGH (ref 4.0–10.5)
WBC: 12.1 10*3/uL — ABNORMAL HIGH (ref 4.0–10.5)
nRBC: 0 % (ref 0.0–0.2)
nRBC: 0 % (ref 0.0–0.2)

## 2020-11-10 LAB — COMPREHENSIVE METABOLIC PANEL
ALT: 26 U/L (ref 0–44)
AST: 13 U/L — ABNORMAL LOW (ref 15–41)
Albumin: 3.6 g/dL (ref 3.5–5.0)
Alkaline Phosphatase: 41 U/L (ref 38–126)
Anion gap: 8 (ref 5–15)
BUN: 15 mg/dL (ref 8–23)
CO2: 25 mmol/L (ref 22–32)
Calcium: 8.9 mg/dL (ref 8.9–10.3)
Chloride: 102 mmol/L (ref 98–111)
Creatinine, Ser: 0.7 mg/dL (ref 0.61–1.24)
GFR, Estimated: >60 mL/min (ref >60)
Glucose, Bld: 124 mg/dL — ABNORMAL HIGH (ref 70–99)
Potassium: 4.1 mmol/L (ref 3.5–5.1)
Sodium: 135 mmol/L (ref 135–145)
Total Bilirubin: 0.6 mg/dL (ref 0.3–1.2)
Total Protein: 6.8 g/dL (ref 6.5–8.1)

## 2020-11-10 LAB — RESP PANEL BY RT-PCR (FLU A&B, COVID) ARPGX2
Influenza A by PCR: NEGATIVE
Influenza B by PCR: NEGATIVE
SARS Coronavirus 2 by RT PCR: NEGATIVE

## 2020-11-10 LAB — BRAIN NATRIURETIC PEPTIDE
B Natriuretic Peptide: 381.9 pg/mL — ABNORMAL HIGH (ref 0.0–100.0)
B Natriuretic Peptide: 170.7 pg/mL — ABNORMAL HIGH (ref 0.0–100.0)

## 2020-11-10 LAB — TROPONIN I (HIGH SENSITIVITY)
Troponin I (High Sensitivity): 16 ng/L (ref <18)
Troponin I (High Sensitivity): 12 ng/L (ref <18)

## 2020-11-10 LAB — LACTIC ACID, PLASMA
Lactic Acid, Venous: 2.1 mmol/L (ref 0.5–1.9)
Lactic Acid, Venous: 1.4 mmol/L (ref 0.5–1.9)

## 2020-11-10 MED ORDER — ALBUTEROL SULFATE HFA 108 (90 BASE) MCG/ACT IN AERS
2.0000 | INHALATION_SPRAY | RESPIRATORY_TRACT | Status: DC | PRN
  Administered 2020-11-10: 2 via RESPIRATORY_TRACT
  Filled 2020-11-10: qty 6.7

## 2020-11-10 MED ORDER — LEVOFLOXACIN IN D5W 750 MG/150ML IV SOLN: 750.0000 mg | Freq: Once | INTRAVENOUS | Status: DC

## 2020-11-10 MED ORDER — ALBUTEROL SULFATE (2.5 MG/3ML) 0.083% IN NEBU
2.5000 mg | INHALATION_SOLUTION | RESPIRATORY_TRACT | Status: DC | PRN
  Administered 2020-11-10: 2.5 mg via RESPIRATORY_TRACT

## 2020-11-10 MED ORDER — LACTATED RINGERS IV SOLN: INTRAVENOUS | Status: DC

## 2020-11-10 MED ORDER — ACETAMINOPHEN 500 MG PO TABS
1000.0000 mg | ORAL_TABLET | Freq: Once | ORAL | Status: AC
  Administered 2020-11-10: 1000 mg via ORAL
  Filled 2020-11-10: qty 2

## 2020-11-10 MED ORDER — ALBUTEROL SULFATE (2.5 MG/3ML) 0.083% IN NEBU
INHALATION_SOLUTION | RESPIRATORY_TRACT | Status: AC
  Filled 2020-11-10: qty 3

## 2020-11-10 MED ORDER — LEVOFLOXACIN IN D5W 750 MG/150ML IV SOLN
750.0000 mg | INTRAVENOUS | Status: DC
  Administered 2020-11-10: 750 mg via INTRAVENOUS
  Filled 2020-11-10 (×2): qty 150

## 2020-11-10 MED ORDER — SODIUM CHLORIDE 0.9 % IV BOLUS (SEPSIS)
1000.0000 mL | Freq: Once | INTRAVENOUS | Status: AC
  Administered 2020-11-10: 1000 mL via INTRAVENOUS

## 2020-11-10 NOTE — Progress Notes (Signed)
Virtual Visit Consent   Clayton Freeman, you are scheduled for a virtual visit with a [facility] provider today.     Just as with appointments in the office, your consent must be obtained to participate.  Your consent will be active for this visit and any virtual visit you may have with one of our providers in the next 365 days.     If you have a MyChart account, a copy of this consent can be sent to you electronically.  All virtual visits are billed to your insurance company just like a traditional visit in the office.    As this is a virtual visit, video technology does not allow for your provider to perform a traditional examination.  This may limit your provider's ability to fully assess your condition.  If your provider identifies any concerns that need to be evaluated in person or the need to arrange testing (such as labs, EKG, etc.), we will make arrangements to do so.     Although advances in technology are sophisticated, we cannot ensure that it will always work on either your end or our end.  If the connection with a video visit is poor, the visit may have to be switched to a telephone visit.  With either a video or telephone visit, we are not always able to ensure that we have a secure connection.     I need to obtain your verbal consent now.   Are you willing to proceed with your visit today?    Clayton Freeman has provided verbal consent on 11/10/2020 for a virtual visit telephone.   Clayton Circle, PA-C   Date: 11/10/2020 1:46 PM   Virtual Visit via Video Note   I, Clayton Freeman, connected with  Clayton Freeman  ([id number], 1955-05-27) on 11/10/20 at  1:45 PM EDT by a video-enabled telemedicine application and verified that I am speaking with the correct person using two identifiers.  Location: Patient: Virtual Visit Location Patient: Home Provider: Virtual Visit Location Provider: Home Office   I discussed the limitations of evaluation and management by telemedicine and  the availability of in person appointments. The patient expressed understanding and agreed to proceed.    History of Present Illness: Clayton Freeman is a 65 y.o. who identifies as a male who was assigned male at birth, and is being seen today for CC of SOB.  Reports shallow and rapid breathing.  Reports having coughing fits for the past few days.  Reports subjective fevers and chills.  Reports night sweats for the past 2 nights.  Has taken 3 negative home COVID tests.  Fully vaccinated and boosted.  Reports some chest discomfort/pressure.  States that he feels very fatigued.  Denies asthma/COPD.  HPI: HPI  Problems:  Patient Active Problem List   Diagnosis Date Noted   Hyperglycemia 05/29/2020   Patellofemoral arthritis of right knee 10/13/2018   Morbid obesity ([place]) 02/13/2018   History of anaphylaxis 02/13/2018   Knee pain 02/13/2018   FAP (familial adenomatous polyposis)    Dyslipidemia 07/11/2015   Essential (primary) hypertension 04/15/2004    Allergies:  Allergies  Allergen Reactions   Bee Venom Anaphylaxis, Hives, Itching, Palpitations, Rash, Shortness Of Breath and Swelling   Penicillins Anaphylaxis, Hives, Itching, Palpitations, Rash, Shortness Of Breath and Swelling   Other     Horse Radish   Erythromycin Hives, Rash and Swelling   Medications:  Current Outpatient Medications:    aspirin EC 81 MG tablet, Take 81 mg by mouth daily.,  Disp: , Rfl:    EPINEPHrine 0.3 mg/0.3 mL IJ SOAJ injection, Inject 0.3 mLs (0.3 mg total) into the muscle once as needed (anaphylaxis/allergic reaction)., Disp: 2 Device, Rfl: 0   lisinopril (ZESTRIL) 40 MG tablet, Take 1 tablet (40 mg total) by mouth daily., Disp: 90 tablet, Rfl: 3   Multiple Vitamin (MULTIVITAMIN) tablet, Take 1 tablet by mouth daily., Disp: , Rfl:    Omega-3 Fatty Acids (FISH OIL) 1000 MG CAPS, Take by mouth., Disp: , Rfl:    VITAMIN E PO, Take by mouth., Disp: , Rfl:   Observations/Objective: Converted to telephone  visit due to poor connection Speech is clear Coughing is noted Awake and alert  Assessment and Plan: There are no diagnoses linked to this encounter. Redirected to ED due to chest pressure and SOB.  Follow Up Instructions: I discussed the assessment and treatment plan with the patient. The patient was provided an opportunity to ask questions and all were answered. The patient agreed with the plan and demonstrated an understanding of the instructions.  A copy of instructions were sent to the patient via MyChart.  The patient was advised to call back or seek an in-person evaluation if the symptoms worsen or if the condition fails to improve as anticipated.  Time:  I spent 13 minutes with the patient via telehealth technology discussing the above problems/concerns.    Clayton Circle, PA-C

## 2020-11-10 NOTE — ED Notes (Signed)
Rhythm

## 2020-11-10 NOTE — ED Notes (Signed)
Rhythm noted on monitor as "Vent Bigeminy"; notified Dr. Dina Rich of this finding at [facility].

## 2020-11-10 NOTE — Sepsis Progress Note (Signed)
eLink is monitoring this Code Sepsis. °

## 2020-11-10 NOTE — ED Notes (Signed)
Repeat cbc and cmet sent to lab as well as 2nd troponin and lactic.

## 2020-11-10 NOTE — Patient Instructions (Signed)
Based on what you shared with me, I feel your condition warrants further evaluation as soon as possible at an Emergency department.    NOTE: There will be NO CHARGE for this visit.   If you are having a true medical emergency please call 911.      Emergency [facility]  Get Driving Directions  [id number]  [address]  [place], [place] [address]  Open 24/7/365      [facility] Emergency Department at [place]  [id number]  [place], [place] [address]  Open 24/7/365    Emergency [facility]  Get Driving Directions  [id number]  [address], [place] [address]  Open 24/7/365      [facility]  Get Driving Directions  [id number]  [address]  [place], [place] [address]  Open 24/7/365    [facility]  Emergency [place]  Get Driving Directions  [id number]  [place], [place] [address]  Open 24/7/365    [place] [facility]  Get Driving Directions  [id number]  [place], [place] [address]  Open 24/7/365    [facility]  Emergency [facility]  Get Driving Directions  [id number]  [address]  [place], [place] [address]  Open 24/7/365

## 2020-11-10 NOTE — ED Notes (Signed)
Ambulated to bathroom. Pt feels hot to touch, states he feels worse, sob walking to bathroom. Temp 101.8, MD aware.

## 2020-11-10 NOTE — ED Provider Notes (Signed)
[facility] EMERGENCY DEPT Provider Note   CSN: [id number] Arrival date & time: 11/10/20  1421     History Chief Complaint  Patient presents with   Shortness of Breath    Clayton Freeman is a 65 y.o. male.  HPI  65 year old male with past medical history of HTN, HLD presents the emergency department with concern for nonproductive cough, shortness of breath, chills and night sweats for the past week.  Patient states initially his symptoms were mild, however over the past 2 days his shortness of breath especially with exertion and speaking has become more severe.  He denies any documented fever but admits to chills and night sweats, soaking the bed.  He states when he takes a deep breath this ends in a coughing spell.  Intermittently feels chest heaviness when he is short of breath but denies any chest or back pain at this time.  No history or current risk factors for DVT/PE.  Denies any GI symptoms.  No abnormal swelling of his lower extremities.  Has been tested multiple times for COVID and is reported to be negative.  Past Medical History:  Diagnosis Date   FAP (familial adenomatous polyposis)    Hypertension     Patient Active Problem List   Diagnosis Date Noted   Hyperglycemia 05/29/2020   Patellofemoral arthritis of right knee 10/13/2018   Morbid obesity ([place]) 02/13/2018   History of anaphylaxis 02/13/2018   Knee pain 02/13/2018   FAP (familial adenomatous polyposis)    Dyslipidemia 07/11/2015   Essential (primary) hypertension 04/15/2004    Past Surgical History:  Procedure Laterality Date   ROTATOR CUFF REPAIR     Jan 2019       Family History  Problem Relation Age of Onset   Hypertension Mother    Colonic polyp Father    Diverticulitis Father    Hypertension Father    Prostate cancer Father     Social History   Tobacco Use   Smoking status: Former   Smokeless tobacco: Never   Tobacco comments:    occ smoke pipe for 2 years  Substance  Use Topics   Alcohol use: Yes    Comment: Very Rarely   Drug use: Never    Home Medications Prior to Admission medications   Medication Sig Start Date End Date Taking? Authorizing Provider  aspirin EC 81 MG tablet Take 81 mg by mouth daily.    [provider]  EPINEPHrine 0.3 mg/0.3 mL IJ SOAJ injection Inject 0.3 mLs (0.3 mg total) into the muscle once as needed (anaphylaxis/allergic reaction). 02/13/18   Vivi Barrack, MD  lisinopril (ZESTRIL) 40 MG tablet Take 1 tablet (40 mg total) by mouth daily. 05/26/20   Vivi Barrack, MD  Multiple Vitamin (MULTIVITAMIN) tablet Take 1 tablet by mouth daily.    [provider]  Omega-3 Fatty Acids (FISH OIL) 1000 MG CAPS Take by mouth.    [provider]  VITAMIN E PO Take by mouth.    [provider]    Allergies    Bee venom, Penicillins, Other, and Erythromycin  Review of Systems   Review of Systems  Constitutional:  Positive for chills and fatigue. Negative for fever.  HENT:  Negative for congestion.   Eyes:  Negative for visual disturbance.  Respiratory:  Positive for cough, chest tightness and shortness of breath. Negative for wheezing.   Cardiovascular:  Negative for chest pain, palpitations and leg swelling.  Gastrointestinal:  Negative for abdominal pain, diarrhea  and vomiting.  Genitourinary:  Negative for dysuria and flank pain.  Musculoskeletal:  Negative for back pain.  Skin:  Negative for rash.  Neurological:  Negative for headaches.   Physical Exam Updated Vital Signs BP 135/70   Pulse 99   Temp 99.8 F (37.7 C)   Resp (!) 25   Ht 6' (1.829 m)   Wt (!) 149.7 kg   SpO2 100%   BMI 44.76 kg/m   Physical Exam Vitals and nursing note reviewed.  Constitutional:      Appearance: Normal appearance. He is obese.  HENT:     Head: Normocephalic.     Mouth/Throat:     Mouth: Mucous membranes are moist.  Cardiovascular:     Rate and Rhythm: Normal rate.  Pulmonary:     Effort:  Tachypnea present. No respiratory distress.     Breath sounds: Examination of the right-lower field reveals decreased breath sounds. Examination of the left-lower field reveals decreased breath sounds. Decreased breath sounds present.  Chest:     Chest wall: No tenderness or crepitus.  Abdominal:     Palpations: Abdomen is soft.     Tenderness: There is no abdominal tenderness.  Musculoskeletal:     Comments: Large BLE but no pitting edema  Skin:    General: Skin is warm.  Neurological:     Mental Status: He is alert and oriented to person, place, and time. Mental status is at baseline.  Psychiatric:        Mood and Affect: Mood normal.    ED Results / Procedures / Treatments   Labs (all labs ordered are listed, but only abnormal results are displayed) Labs Reviewed  CBC WITH DIFFERENTIAL/PLATELET - Abnormal; Notable for the following components:      Result Value   WBC 12.3 (*)    RBC 4.18 (*)    Neutro Abs 9.5 (*)    Monocytes Absolute 1.2 (*)    All other components within normal limits  RESP PANEL BY RT-PCR (FLU A&B, COVID) ARPGX2  COMPREHENSIVE METABOLIC PANEL  BRAIN NATRIURETIC PEPTIDE  LACTIC ACID, PLASMA  LACTIC ACID, PLASMA  TROPONIN I (HIGH SENSITIVITY)    EKG EKG Interpretation  Date/Time:  Friday November 10 2020 14:33:40 EDT Ventricular Rate:  101 PR Interval:  152 QRS Duration: 138 QT Interval:  378 QTC Calculation: 490 R Axis:   -48 Text Interpretation: Sinus tachycardia Right bundle branch block Left anterior fascicular block  Bifascicular block Abnormal ECG RBBB, no previous Confirmed by Lavenia Atlas [phone]) on 11/10/2020 3:10:03 PM  Radiology DG Chest Port 1 View  Result Date: 11/10/2020 CLINICAL DATA:  Shortness of breath, cough, chills EXAM: PORTABLE CHEST 1 VIEW COMPARISON:  None. FINDINGS: Heart size is upper limits of normal. Mildly increased interstitial markings with patchy opacity in the peripheral aspect of the right lung base. No pleural  effusion or pneumothorax. Chronic appearing left-sided rib deformities. IMPRESSION: Mildly increased interstitial markings with patchy opacity in the peripheral aspect of the right lung base, suspicious for developing pneumonia (consider atypical/viral infection including COVID-19). Electronically Signed   By: Davina Poke D.O.   On: 11/10/2020 15:09    Procedures .Critical Care  Date/Time: 11/10/2020 9:33 PM Performed by: Lorelle Gibbs, DO Authorized by: Lorelle Gibbs, DO   Critical care provider statement:    Critical care time (minutes):  45   Critical care was necessary to treat or prevent imminent or life-threatening deterioration of the following conditions:  Sepsis   Critical  care was time spent personally by me on the following activities:  Discussions with consultants, evaluation of patient's response to treatment, examination of patient, ordering and performing treatments and interventions, ordering and review of laboratory studies, ordering and review of radiographic studies, pulse oximetry, re-evaluation of patient's condition, obtaining history from patient or surrogate and review of old charts   I assumed direction of critical care for this patient from another provider in my specialty: no     Care discussed with: admitting provider     Medications Ordered in ED Medications  albuterol (VENTOLIN HFA) 108 (90 Base) MCG/ACT inhaler 2 puff (has no administration in time range)    ED Course  I have reviewed the triage vital signs and the nursing notes.  Pertinent labs & imaging results that were available during my care of the patient were reviewed by me and considered in my medical decision making (see chart for details).    MDM Rules/Calculators/A&P                           65 year old male presents the emergency department with ongoing shortness of breath, cough.  He is febrile, tachycardic and noticeably dyspneic.  Body habitus limits lung auscultation but  there is diminished breath sounds bilaterally.  Blood work shows a leukocytosis of 12.1.  Initial lactic of 2.1, BNP is elevated close to 400, flu and COVID are negative. EKG shows RBBB, no previous to compare, trop negative.  Chest x-ray shows developing right-sided pneumonia.  Vitals are improved with treatment, patient feels better after breathing treatment however continues to be noticeably short of breath with any movement or conversation.  We will treat for pneumonia and admit.  Patients evaluation and results requires admission for further treatment and care. Patient agrees with admission plan, offers no new complaints and is stable/unchanged at time of admit.  Final Clinical Impression(s) / ED Diagnoses Final diagnoses:  SOB (shortness of breath)    Rx / DC Orders ED Discharge Orders     None        Lorelle Gibbs, DO 11/10/20 2134

## 2020-11-10 NOTE — ED Notes (Signed)
Administered inhaler/via aero chamber. Instructed patient on use and teachback.

## 2020-11-10 NOTE — ED Triage Notes (Signed)
Pt presents to ED POV. Pt c/o SOB, cough, cold chills, night sweats x1w. Pt reports dyspnea on exertion. Pt tachypniec in triage

## 2020-11-11 ENCOUNTER — Encounter (HOSPITAL_COMMUNITY): Payer: Self-pay | Admitting: Internal Medicine

## 2020-11-11 DIAGNOSIS — Z7982 Long term (current) use of aspirin: Secondary | ICD-10-CM | POA: Diagnosis not present

## 2020-11-11 DIAGNOSIS — I480 Paroxysmal atrial fibrillation: Secondary | ICD-10-CM | POA: Diagnosis not present

## 2020-11-11 DIAGNOSIS — R0602 Shortness of breath: Secondary | ICD-10-CM | POA: Diagnosis not present

## 2020-11-11 DIAGNOSIS — J189 Pneumonia, unspecified organism: Secondary | ICD-10-CM | POA: Diagnosis not present

## 2020-11-11 DIAGNOSIS — Z20822 Contact with and (suspected) exposure to covid-19: Secondary | ICD-10-CM | POA: Diagnosis not present

## 2020-11-11 DIAGNOSIS — Z79899 Other long term (current) drug therapy: Secondary | ICD-10-CM | POA: Diagnosis not present

## 2020-11-11 DIAGNOSIS — I4891 Unspecified atrial fibrillation: Secondary | ICD-10-CM

## 2020-11-11 DIAGNOSIS — I10 Essential (primary) hypertension: Secondary | ICD-10-CM

## 2020-11-11 DIAGNOSIS — Z87891 Personal history of nicotine dependence: Secondary | ICD-10-CM | POA: Diagnosis not present

## 2020-11-11 LAB — MRSA NEXT GEN BY PCR, NASAL: MRSA by PCR Next Gen: NOT DETECTED

## 2020-11-11 LAB — HIV ANTIBODY (ROUTINE TESTING W REFLEX): HIV Screen 4th Generation wRfx: NONREACTIVE

## 2020-11-11 LAB — PROCALCITONIN: Procalcitonin: <0.1 ng/mL

## 2020-11-11 MED ORDER — MORPHINE SULFATE (PF) 2 MG/ML IV SOLN: 2.0000 mg | INTRAVENOUS | Status: DC | PRN

## 2020-11-11 MED ORDER — BISACODYL 5 MG PO TBEC: 5.0000 mg | DELAYED_RELEASE_TABLET | Freq: Every day | ORAL | Status: DC | PRN

## 2020-11-11 MED ORDER — DOCUSATE SODIUM 100 MG PO CAPS
100.0000 mg | ORAL_CAPSULE | Freq: Two times a day (BID) | ORAL | Status: DC
  Administered 2020-11-11 – 2020-11-12 (×2): 100 mg via ORAL
  Filled 2020-11-11 (×3): qty 1

## 2020-11-11 MED ORDER — POLYETHYLENE GLYCOL 3350 17 G PO PACK
17.0000 g | PACK | Freq: Every day | ORAL | Status: DC | PRN
  Administered 2020-11-11: 17 g via ORAL
  Filled 2020-11-11: qty 1

## 2020-11-11 MED ORDER — GUAIFENESIN ER 600 MG PO TB12: 600.0000 mg | ORAL_TABLET | Freq: Two times a day (BID) | ORAL | Status: DC | PRN

## 2020-11-11 MED ORDER — ASPIRIN EC 81 MG PO TBEC
81.0000 mg | DELAYED_RELEASE_TABLET | Freq: Every day | ORAL | Status: DC
  Administered 2020-11-11 – 2020-11-12 (×2): 81 mg via ORAL
  Filled 2020-11-11 (×2): qty 1

## 2020-11-11 MED ORDER — DOXYCYCLINE HYCLATE 100 MG PO TABS
100.0000 mg | ORAL_TABLET | Freq: Two times a day (BID) | ORAL | Status: DC
  Administered 2020-11-11 – 2020-11-12 (×3): 100 mg via ORAL
  Filled 2020-11-11 (×3): qty 1

## 2020-11-11 MED ORDER — ACETAMINOPHEN 325 MG PO TABS
650.0000 mg | ORAL_TABLET | Freq: Four times a day (QID) | ORAL | Status: DC | PRN
  Administered 2020-11-11: 650 mg via ORAL
  Filled 2020-11-11: qty 2

## 2020-11-11 MED ORDER — ONDANSETRON HCL 4 MG/2ML IJ SOLN: 4.0000 mg | Freq: Four times a day (QID) | INTRAMUSCULAR | Status: DC | PRN

## 2020-11-11 MED ORDER — CEFTRIAXONE SODIUM 2 G IJ SOLR
2.0000 g | INTRAMUSCULAR | Status: DC
  Administered 2020-11-11: 2 g via INTRAVENOUS
  Filled 2020-11-11: qty 2
  Filled 2020-11-11: qty 20

## 2020-11-11 MED ORDER — ENOXAPARIN SODIUM 80 MG/0.8ML IJ SOSY
80.0000 mg | PREFILLED_SYRINGE | INTRAMUSCULAR | Status: DC
  Administered 2020-11-11: 80 mg via SUBCUTANEOUS
  Filled 2020-11-11: qty 0.8

## 2020-11-11 MED ORDER — ACETAMINOPHEN 650 MG RE SUPP: 650.0000 mg | Freq: Four times a day (QID) | RECTAL | Status: DC | PRN

## 2020-11-11 MED ORDER — ALBUTEROL SULFATE (2.5 MG/3ML) 0.083% IN NEBU: 2.5000 mg | INHALATION_SOLUTION | RESPIRATORY_TRACT | Status: DC | PRN

## 2020-11-11 MED ORDER — ONDANSETRON HCL 4 MG PO TABS: 4.0000 mg | ORAL_TABLET | Freq: Four times a day (QID) | ORAL | Status: DC | PRN

## 2020-11-11 MED ORDER — HYDRALAZINE HCL 20 MG/ML IJ SOLN: 5.0000 mg | INTRAMUSCULAR | Status: DC | PRN

## 2020-11-11 MED ORDER — SODIUM CHLORIDE 0.9 % IV SOLN: INTRAVENOUS | Status: DC

## 2020-11-11 MED ORDER — ACETAMINOPHEN 325 MG PO TABS
650.0000 mg | ORAL_TABLET | Freq: Once | ORAL | Status: AC
  Administered 2020-11-11: 650 mg via ORAL
  Filled 2020-11-11: qty 2

## 2020-11-11 MED ORDER — LISINOPRIL 40 MG PO TABS
40.0000 mg | ORAL_TABLET | Freq: Every day | ORAL | Status: DC
  Administered 2020-11-11 – 2020-11-12 (×2): 40 mg via ORAL
  Filled 2020-11-11 (×2): qty 1

## 2020-11-11 MED ORDER — HYDROCODONE-ACETAMINOPHEN 5-325 MG PO TABS
1.0000 | ORAL_TABLET | ORAL | Status: DC | PRN
  Administered 2020-11-11: 1 via ORAL
  Filled 2020-11-11: qty 1

## 2020-11-11 NOTE — Plan of Care (Signed)
  Problem: Education: Goal: Knowledge of General Education information will improve Description: Including pain rating scale, medication(s)/side effects and non-pharmacologic comfort measures Outcome: Progressing   Problem: Health Behavior/Discharge Planning: Goal: Ability to manage health-related needs will improve Outcome: Progressing   Problem: Clinical Measurements: Goal: Ability to maintain clinical measurements within normal limits will improve Outcome: Progressing Goal: Will remain free from infection Outcome: Progressing Goal: Diagnostic test results will improve Outcome: Progressing Goal: Respiratory complications will improve Outcome: Progressing Goal: Cardiovascular complication will be avoided Outcome: Progressing   Problem: Activity: Goal: Risk for activity intolerance will decrease Outcome: Progressing   Problem: Nutrition: Goal: Adequate nutrition will be maintained Outcome: Progressing   Problem: Coping: Goal: Level of anxiety will decrease Outcome: Progressing   Problem: Elimination: Goal: Will not experience complications related to bowel motility Outcome: Progressing Goal: Will not experience complications related to urinary retention Outcome: Progressing   Problem: Pain Managment: Goal: General experience of comfort will improve Outcome: Progressing   Problem: Safety: Goal: Ability to remain free from injury will improve Outcome: Progressing   Problem: Skin Integrity: Goal: Risk for impaired skin integrity will decrease Outcome: Progressing   Problem: Activity: Goal: Ability to tolerate increased activity will improve Outcome: Progressing   Problem: Clinical Measurements: Goal: Ability to maintain a body temperature in the normal range will improve Outcome: Not Progressing   Problem: Respiratory: Goal: Ability to maintain adequate ventilation will improve Outcome: Not Progressing Goal: Ability to maintain a clear airway will  improve Outcome: Progressing

## 2020-11-11 NOTE — Progress Notes (Signed)
Pt heart rate decreased from 140s-150s to 90s now rhythm reading Ventricular Trigeminy 90s-100s.

## 2020-11-11 NOTE — Progress Notes (Addendum)
Pt arriving to unit alert & oriented, ra, VSS except heart rate 140s-150s sustained in afib rvr. EKG obtained.

## 2020-11-11 NOTE — ED Notes (Signed)
Report called to [facility] unit 2000.

## 2020-11-11 NOTE — ED Notes (Signed)
Report given to [facility] 
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
## 2020-11-12 DIAGNOSIS — J189 Pneumonia, unspecified organism: Secondary | ICD-10-CM | POA: Diagnosis not present

## 2020-11-12 DIAGNOSIS — A419 Sepsis, unspecified organism: Secondary | ICD-10-CM

## 2020-11-12 LAB — CBC
HCT: 34.2 % — ABNORMAL LOW (ref 39.0–52.0)
Hemoglobin: 11.4 g/dL — ABNORMAL LOW (ref 13.0–17.0)
MCH: 31.8 pg (ref 26.0–34.0)
MCHC: 33.3 g/dL (ref 30.0–36.0)
MCV: 95.5 fL (ref 80.0–100.0)
Platelets: 185 10*3/uL (ref 150–400)
RBC: 3.58 MIL/uL — ABNORMAL LOW (ref 4.22–5.81)
RDW: 12.6 % (ref 11.5–15.5)
WBC: 11.9 10*3/uL — ABNORMAL HIGH (ref 4.0–10.5)
nRBC: 0 % (ref 0.0–0.2)

## 2020-11-12 LAB — BASIC METABOLIC PANEL
Anion gap: 10 (ref 5–15)
BUN: 8 mg/dL (ref 8–23)
CO2: 24 mmol/L (ref 22–32)
Calcium: 8.4 mg/dL — ABNORMAL LOW (ref 8.9–10.3)
Chloride: 101 mmol/L (ref 98–111)
Creatinine, Ser: 0.72 mg/dL (ref 0.61–1.24)
GFR, Estimated: >60 mL/min (ref >60)
Glucose, Bld: 119 mg/dL — ABNORMAL HIGH (ref 70–99)
Potassium: 3.8 mmol/L (ref 3.5–5.1)
Sodium: 135 mmol/L (ref 135–145)

## 2020-11-12 MED ORDER — ALBUTEROL SULFATE HFA 108 (90 BASE) MCG/ACT IN AERS: 2.0000 | INHALATION_SPRAY | Freq: Four times a day (QID) | RESPIRATORY_TRACT | 0 refills | Status: DC | PRN

## 2020-11-12 MED ORDER — GUAIFENESIN ER 600 MG PO TB12: 600.0000 mg | ORAL_TABLET | Freq: Two times a day (BID) | ORAL | 0 refills | Status: DC | PRN

## 2020-11-12 MED ORDER — LEVOFLOXACIN 750 MG PO TABS: 750.0000 mg | ORAL_TABLET | Freq: Every day | ORAL | 0 refills | Status: DC

## 2020-11-12 NOTE — Evaluation (Signed)
Physical Therapy Evaluation Patient Details Name: Clayton Freeman MRN: [id number] DOB: Apr 06, 1956 Today's Date: 11/12/2020   History of Present Illness  65 y.o. male with medical history significant of HTN; HLD; and morbid obesity presenting with SOB.   Patient found to have right lower lobe infiltrate on chest x-ray as well as multiple SIRS criteria including leukocytosis, fever of 101 F, tachycardia and lactic acid of 2.2. Admitted for observation 11/10/20 for treatment of sepsis due to RLL PNA and new onset of Afib. PMH" obesity and hypertension.  Clinical Impression  Patient evaluated by Physical Therapy with no further acute PT needs identified. All education has been completed and the patient has no further questions. Pt is independent in all mobility and has no Physical Therapy or equipment needs. PT is signing off. Thank you for this referral.     Follow Up Recommendations No PT follow up;Supervision - Intermittent    Equipment Recommendations  None recommended by PT       Precautions / Restrictions Precautions Precautions: None Restrictions Weight Bearing Restrictions: No      Mobility  Bed Mobility Overal bed mobility: Independent                  Transfers Overall transfer level: Independent                  Ambulation/Gait Ambulation/Gait assistance: Independent Gait Distance (Feet): 80 Feet Assistive device: None Gait Pattern/deviations: Step-through pattern;WFL(Within Functional Limits)   Gait velocity interpretation: >2.62 ft/sec, indicative of community ambulatory General Gait Details: WFL        Balance Overall balance assessment: Independent                                           Pertinent Vitals/Pain Pain Assessment: No/denies pain    Home Living Family/patient expects to be discharged to:: Private residence Living Arrangements: Spouse/significant other Available Help at Discharge: Available 24  hours/day;Family Type of Home: House Home Access: Stairs to enter   [employer] of Steps: 2 Home Layout: Multi-level;Able to live on main level with bedroom/bathroom Home Equipment: Shower seat - built in      Prior Function Level of Independence: Independent         Comments: drives, working full time     Journalist, newspaper        Extremity/Trunk Assessment   Upper Extremity Assessment Upper Extremity Assessment: Overall WFL for tasks assessed    Lower Extremity Assessment Lower Extremity Assessment: Overall WFL for tasks assessed       Communication   Communication: No difficulties  Cognition Arousal/Alertness: Awake/alert Behavior During Therapy: WFL for tasks assessed/performed Overall Cognitive Status: Within Functional Limits for tasks assessed                                        General Comments General comments (skin integrity, edema, etc.): Pt with 3/4 DoE with short distance ambulatin SaO2 on RA 94%O2, HR 96bpm        Assessment/Plan    PT Assessment Patent does not need any further PT services         PT Goals (Current goals can be found in the Care Plan section)  Acute Rehab PT Goals Patient Stated Goal: ride his [place]  PT "6 Clicks" Mobility  Outcome Measure Help needed turning from your back to your side while in a flat bed without using bedrails?: None Help needed moving from lying on your back to sitting on the side of a flat bed without using bedrails?: None Help needed moving to and from a bed to a chair (including a wheelchair)?: None Help needed standing up from a chair using your arms (e.g., wheelchair or bedside chair)?: None Help needed to walk in hospital room?: None Help needed climbing 3-5 steps with a railing? : None 6 Click Score: 24    End of Session   Activity Tolerance: Patient tolerated treatment well Patient left: in bed;with call bell/phone within reach Nurse Communication:  Mobility status PT Visit Diagnosis: Difficulty in walking, not elsewhere classified (R26.2)    Time: [id number] PT Time Calculation (min) (ACUTE ONLY): 16 min   Charges:   PT Evaluation $PT Eval Low Complexity: 1 Low          Sevannah Madia B. Migdalia Dk PT, DPT Acute Rehabilitation Services Pager [phone] Office [phone]   [place] 11/12/2020, 9:01 AM

## 2020-11-12 NOTE — Discharge Summary (Signed)
Physician Discharge Summary  Clayton Freeman [id number] DOB: [birth year] DOA: 11/10/2020  PCP: Vivi Barrack, MD  Admit date: 11/10/2020 Discharge date: 11/12/2020  Admitted From: Home Disposition: Home  Recommendations for Outpatient Follow-up:  Follow up with PCP in 1 week with repeat CBC/BMP Follow up in ED if symptoms worsen or new appear   Home Health: No Equipment/Devices: None  Discharge Condition: Stable CODE STATUS: Full Diet recommendation: Heart healthy  Brief/Interim Summary: 65 year old male with history of hypertension, hyperlipidemia and morbid obesity presented with shortness of breath and increasing cough.  On presentation, patient was febrile to 101 with tachycardia and leukocytosis with elevated lactic acid level.  Chest x-ray showed possible right lower lobe infiltrate.  Influenza and COVID-19 test were negative.  He was started on IV fluids and antibiotics.  His condition has improved.  He is currently on room air.  He is afebrile and wants to go home today.  He will be discharged home on oral antibiotics.  Discharge Diagnoses:   Severe sepsis: Present on admission Right lower lobe pneumonia -Presented with fever, tachycardia, tachypnea, leukocytosis with elevated lactic acid level and pneumonia -Influenza and COVID-19 test were negative.  Currently on IV antibiotics.  Procalcitonin less than 0.1. -Currently afebrile and on room air and wants to go home. -Sepsis has resolved.  Cultures negative so far.   -Discharged home on oral Levaquin for 5 days.  Paroxysmal A. Fib -Possibly from above.  Currently rate well.  Continue aspirin.  Outpatient follow-up with PCP regarding need for starting anticoagulation if continues to have issues with A. fib  Hypertension -Continue lisinopril  Hyperlipidemia -Continue fish oil  Morbid obesity -Outpatient follow-up   Discharge Instructions  Discharge Instructions     Diet - low sodium heart healthy   Complete  by: As directed    Increase activity slowly   Complete by: As directed       Allergies as of 11/12/2020       Reactions   Bee Venom Anaphylaxis, Hives, Itching, Palpitations, Rash, Shortness Of Breath, Swelling   Penicillins Anaphylaxis, Hives, Itching, Palpitations, Rash, Shortness Of Breath, Swelling   Other    Horse Radish   Erythromycin Hives, Rash, Swelling        Medication List     TAKE these medications    albuterol 108 (90 Base) MCG/ACT inhaler Commonly known as: VENTOLIN HFA Inhale 2 puffs into the lungs every 6 (six) hours as needed for wheezing or shortness of breath.   aspirin EC 81 MG tablet Take 81 mg by mouth daily.   EPINEPHrine 0.3 mg/0.3 mL Soaj injection Commonly known as: EPI-PEN Inject 0.3 mLs (0.3 mg total) into the muscle once as needed (anaphylaxis/allergic reaction).   Fish Oil 1000 MG Caps Take 1,000 mg by mouth daily at 6 (six) AM.   guaiFENesin 600 MG 12 hr tablet Commonly known as: MUCINEX Take 1 tablet (600 mg total) by mouth 2 (two) times daily as needed for cough.   levofloxacin 750 MG tablet Commonly known as: Levaquin Take 1 tablet (750 mg total) by mouth daily for 5 days.   lisinopril 40 MG tablet Commonly known as: ZESTRIL Take 1 tablet (40 mg total) by mouth daily.   multivitamin tablet Take 1 tablet by mouth daily.   VITAMIN E PO Take 1 capsule by mouth daily.        Follow-up Information     Vivi Barrack, MD. Schedule an appointment as soon as possible for a visit in  1 week(s).   Specialty: Family Medicine Contact information: [place] [address] [phone]                Allergies  Allergen Reactions   Bee Venom Anaphylaxis, Hives, Itching, Palpitations, Rash, Shortness Of Breath and Swelling   Penicillins Anaphylaxis, Hives, Itching, Palpitations, Rash, Shortness Of Breath and Swelling   Other     Horse Radish   Erythromycin Hives, Rash and Swelling     Consultations: None   Procedures/Studies: DG Chest Port 1 View  Result Date: 11/10/2020 CLINICAL DATA:  Shortness of breath, cough, chills EXAM: PORTABLE CHEST 1 VIEW COMPARISON:  None. FINDINGS: Heart size is upper limits of normal. Mildly increased interstitial markings with patchy opacity in the peripheral aspect of the right lung base. No pleural effusion or pneumothorax. Chronic appearing left-sided rib deformities. IMPRESSION: Mildly increased interstitial markings with patchy opacity in the peripheral aspect of the right lung base, suspicious for developing pneumonia (consider atypical/viral infection including COVID-19). Electronically Signed   By: Davina Poke D.O.   On: 11/10/2020 15:09      Subjective: Patient seen and examined at bedside.  He feels much better and wants to go home today.  Denies worsening fever, worsening cough or shortness of breath.  Discharge Exam: Vitals:   11/11/20 2203 11/12/20 0434  BP: 119/68 115/70  Pulse:  98  Resp: 18 20  Temp: 99.6 F (37.6 C) 98.9 F (37.2 C)  SpO2: 96% 92%    General: Pt is alert, awake, not in acute distress Cardiovascular: rate controlled, S1/S2 + Respiratory: bilateral decreased breath sounds at bases with some scattered crackles Abdominal: Soft, morbidly obese NT, ND, bowel sounds + Extremities: Trace lower extremity edema; no cyanosis    The results of significant diagnostics from this hospitalization (including imaging, microbiology, ancillary and laboratory) are listed below for reference.     Microbiology: Recent Results (from the past 240 hour(s))  Resp Panel by RT-PCR (Flu A&B, Covid) Nasopharyngeal Swab     Status: None   Collection Time: 11/10/20  2:34 PM   Specimen: Nasopharyngeal Swab; Nasopharyngeal(NP) swabs in vial transport medium  Result Value Ref Range Status   SARS Coronavirus 2 by RT PCR NEGATIVE NEGATIVE Final    Comment: (NOTE) SARS-CoV-2 target nucleic acids are NOT  DETECTED.  The SARS-CoV-2 RNA is generally detectable in upper respiratory specimens during the acute phase of infection. The lowest concentration of SARS-CoV-2 viral copies this assay can detect is 138 copies/mL. A negative result does not preclude SARS-Cov-2 infection and should not be used as the sole basis for treatment or other patient management decisions. A negative result may occur with  improper specimen collection/handling, submission of specimen other than nasopharyngeal swab, presence of viral mutation(s) within the areas targeted by this assay, and inadequate number of viral copies(<138 copies/mL). A negative result must be combined with clinical observations, patient history, and epidemiological information. The expected result is Negative.  Fact Sheet for Patients:  EntrepreneurPulse.com.au  Fact Sheet for Healthcare Providers:  IncredibleEmployment.be  This test is no t yet approved or cleared by the [place] FDA and  has been authorized for detection and/or diagnosis of SARS-CoV-2 by FDA under an Emergency Use Authorization (EUA). This EUA will remain  in effect (meaning this test can be used) for the duration of the COVID-19 declaration under Section 564(b)(1) of the Act, 21 U.S.C.section 360bbb-3(b)(1), unless the authorization is terminated  or revoked sooner.  Influenza A by PCR NEGATIVE NEGATIVE Final   Influenza B by PCR NEGATIVE NEGATIVE Final    Comment: (NOTE) The Xpert Xpress SARS-CoV-2/FLU/RSV plus assay is intended as an aid in the diagnosis of influenza from Nasopharyngeal swab specimens and should not be used as a sole basis for treatment. Nasal washings and aspirates are unacceptable for Xpert Xpress SARS-CoV-2/FLU/RSV testing.  Fact Sheet for Patients: EntrepreneurPulse.com.au  Fact Sheet for Healthcare Providers: IncredibleEmployment.be  This test is not yet  approved or cleared by the [place] FDA and has been authorized for detection and/or diagnosis of SARS-CoV-2 by FDA under an Emergency Use Authorization (EUA). This EUA will remain in effect (meaning this test can be used) for the duration of the COVID-19 declaration under Section 564(b)(1) of the Act, 21 U.S.C. section 360bbb-3(b)(1), unless the authorization is terminated or revoked.  Performed at [facility], [address] [place], [place] [address]   Culture, blood (single)     Status: None (Preliminary result)   Collection Time: 11/10/20  6:43 PM   Specimen: BLOOD  Result Value Ref Range Status   Specimen Description   Final    BLOOD BLOOD LEFT FOREARM Performed at [facility], [address] [place], [place] [address]    Special Requests   Final    BOTTLES DRAWN AEROBIC AND ANAEROBIC Blood Culture adequate volume Performed at [facility], [address] [place], [place] [address]    Culture   Final    NO GROWTH < 24 HOURS Performed at [facility] [address] [place], [place] [address]    Report Status PENDING  Incomplete  MRSA Next Gen by PCR, Nasal     Status: None   Collection Time: 11/11/20  4:01 PM   Specimen: Nasal Mucosa; Nasal Swab  Result Value Ref Range Status   MRSA by PCR Next Gen NOT DETECTED NOT DETECTED Final    Comment: (NOTE) The GeneXpert MRSA Assay (FDA approved for NASAL specimens only), is one component of a comprehensive MRSA colonization surveillance program. It is not intended to diagnose MRSA infection nor to guide or monitor treatment for MRSA infections. Test performance is not FDA approved in patients less than 30 years old. Performed at [facility], [place] [address] [place],  [address]      Labs: BNP (last 3 results) Recent Labs    11/10/20 1511 11/10/20 1720  BNP 381.9* [id number]*   Basic Metabolic Panel: Recent Labs  Lab 11/10/20 1720  11/12/20 0044  NA 135 135  K 4.1 3.8  CL 102 101  CO2 25 24  GLUCOSE 124* 119*  BUN 15 8  CREATININE 0.70 0.72  CALCIUM 8.9 8.4*   Liver Function Tests: Recent Labs  Lab 11/10/20 1720  AST 13*  ALT 26  ALKPHOS 41  BILITOT 0.6  PROT 6.8  ALBUMIN 3.6   No results for input(s): LIPASE, AMYLASE in the last 168 hours. No results for input(s): AMMONIA in the last 168 hours. CBC: Recent Labs  Lab 11/10/20 1511 11/10/20 1720 11/12/20 0044  WBC 12.3* 12.1* 11.9*  NEUTROABS 9.5* 9.4*  --   HGB 13.3 12.8* 11.4*  HCT 39.6 37.5* 34.2*  MCV 94.7 93.8 95.5  PLT 192 196 185   Cardiac Enzymes: No results for input(s): CKTOTAL, CKMB, CKMBINDEX, TROPONINI in the last 168 hours. BNP: Invalid input(s): POCBNP CBG: No results for input(s): GLUCAP in the last 168 hours. D-Dimer No results for input(s): DDIMER in  the last 72 hours. Hgb A1c No results for input(s): HGBA1C in the last 72 hours. Lipid Profile No results for input(s): CHOL, HDL, LDLCALC, TRIG, CHOLHDL, LDLDIRECT in the last 72 hours. Thyroid function studies No results for input(s): TSH, T4TOTAL, T3FREE, THYROIDAB in the last 72 hours.  Invalid input(s): FREET3 Anemia work up No results for input(s): VITAMINB12, FOLATE, FERRITIN, TIBC, IRON, RETICCTPCT in the last 72 hours. Urinalysis No results found for: COLORURINE, APPEARANCEUR, [place], [place], GLUCOSEU, [place], [place], [place], PROTEINUR, UROBILINOGEN, NITRITE, LEUKOCYTESUR Sepsis Labs Invalid input(s): PROCALCITONIN,  WBC,  LACTICIDVEN Microbiology Recent Results (from the past 240 hour(s))  Resp Panel by RT-PCR (Flu A&B, Covid) Nasopharyngeal Swab     Status: None   Collection Time: 11/10/20  2:34 PM   Specimen: Nasopharyngeal Swab; Nasopharyngeal(NP) swabs in vial transport medium  Result Value Ref Range Status   SARS Coronavirus 2 by RT PCR NEGATIVE NEGATIVE Final    Comment: (NOTE) SARS-CoV-2 target nucleic acids are NOT DETECTED.  The  SARS-CoV-2 RNA is generally detectable in upper respiratory specimens during the acute phase of infection. The lowest concentration of SARS-CoV-2 viral copies this assay can detect is 138 copies/mL. A negative result does not preclude SARS-Cov-2 infection and should not be used as the sole basis for treatment or other patient management decisions. A negative result may occur with  improper specimen collection/handling, submission of specimen other than nasopharyngeal swab, presence of viral mutation(s) within the areas targeted by this assay, and inadequate number of viral copies(<138 copies/mL). A negative result must be combined with clinical observations, patient history, and epidemiological information. The expected result is Negative.  Fact Sheet for Patients:  EntrepreneurPulse.com.au  Fact Sheet for Healthcare Providers:  IncredibleEmployment.be  This test is no t yet approved or cleared by the [place] FDA and  has been authorized for detection and/or diagnosis of SARS-CoV-2 by FDA under an Emergency Use Authorization (EUA). This EUA will remain  in effect (meaning this test can be used) for the duration of the COVID-19 declaration under Section 564(b)(1) of the Act, 21 U.S.C.section 360bbb-3(b)(1), unless the authorization is terminated  or revoked sooner.       Influenza A by PCR NEGATIVE NEGATIVE Final   Influenza B by PCR NEGATIVE NEGATIVE Final    Comment: (NOTE) The Xpert Xpress SARS-CoV-2/FLU/RSV plus assay is intended as an aid in the diagnosis of influenza from Nasopharyngeal swab specimens and should not be used as a sole basis for treatment. Nasal washings and aspirates are unacceptable for Xpert Xpress SARS-CoV-2/FLU/RSV testing.  Fact Sheet for Patients: EntrepreneurPulse.com.au  Fact Sheet for Healthcare Providers: IncredibleEmployment.be  This test is not yet approved or  cleared by the [place] FDA and has been authorized for detection and/or diagnosis of SARS-CoV-2 by FDA under an Emergency Use Authorization (EUA). This EUA will remain in effect (meaning this test can be used) for the duration of the COVID-19 declaration under Section 564(b)(1) of the Act, 21 U.S.C. section 360bbb-3(b)(1), unless the authorization is terminated or revoked.  Performed at [facility], [address] [place], [place] [address]   Culture, blood (single)     Status: None (Preliminary result)   Collection Time: 11/10/20  6:43 PM   Specimen: BLOOD  Result Value Ref Range Status   Specimen Description   Final    BLOOD BLOOD LEFT FOREARM Performed at [facility], [address], [place], [place] [address]    Special Requests   Final    BOTTLES DRAWN  AEROBIC AND ANAEROBIC Blood Culture adequate volume Performed at [facility], [address], [place], [place] [address]    Culture   Final    NO GROWTH < 24 HOURS Performed at [facility], [place] [address] [place], [place] [address]    Report Status PENDING  Incomplete  MRSA Next Gen by PCR, Nasal     Status: None   Collection Time: 11/11/20  4:01 PM   Specimen: Nasal Mucosa; Nasal Swab  Result Value Ref Range Status   MRSA by PCR Next Gen NOT DETECTED NOT DETECTED Final    Comment: (NOTE) The GeneXpert MRSA Assay (FDA approved for NASAL specimens only), is one component of a comprehensive MRSA colonization surveillance program. It is not intended to diagnose MRSA infection nor to guide or monitor treatment for MRSA infections. Test performance is not FDA approved in patients less than 14 years old. Performed at [facility], [place] [address] [place], [place] [address]      Time coordinating discharge: 35 minutes  SIGNED:   Aline August, MD  [facility] 11/12/2020, 11:21 AM

## 2020-11-12 NOTE — TOC Transition Note (Signed)
Transition of Care [facility]) - CM/SW Discharge Note   Patient Details  Name: Clayton Freeman MRN: [id number] Date of Birth: Aug 04, 1955  [facility] of [facility]) CM/SW Contact:  Pollie Friar, RN Phone Number: 11/12/2020, 10:48 AM   Clinical Narrative:    Patient is discharging home with self care. No f/u per PT/OT and no DME needs.  Pt has transportation home.   Final next level of care: Home/Self Care Barriers to Discharge: No Barriers Identified   Patient Goals and CMS Choice        Discharge Placement                       Discharge Plan and Services                                     Social Determinants of Health (SDOH) Interventions     Readmission Risk Interventions No flowsheet data found.

## 2020-11-12 NOTE — Progress Notes (Signed)
OT Cancellation Note  Patient Details Name: Clayton Freeman MRN: [id number] DOB: 31-Oct-1955   Cancelled Treatment:    Reason Eval/Treat Not Completed: OT screened, no needs identified, will sign off Per discussion with PT, pt is independent.  Jefferey Pica, OTR/L Acute Rehabilitation Services Pager: [phone] Office: [phone]   Augustin Schooling 11/12/2020, 9:09 AM

## 2020-11-13 LAB — EXPECTORATED SPUTUM ASSESSMENT W GRAM STAIN, RFLX TO RESP C

## 2020-11-15 ENCOUNTER — Encounter: Payer: Self-pay | Admitting: Family Medicine

## 2020-11-15 ENCOUNTER — Other Ambulatory Visit: Payer: Self-pay

## 2020-11-15 ENCOUNTER — Ambulatory Visit: Payer: BC Managed Care – PPO | Admitting: Family Medicine

## 2020-11-15 VITALS — BP 146/80 | HR 100 | Temp 99.0°F | Ht 72.0 in | Wt 335.6 lb

## 2020-11-15 DIAGNOSIS — J189 Pneumonia, unspecified organism: Secondary | ICD-10-CM | POA: Diagnosis not present

## 2020-11-15 DIAGNOSIS — I4891 Unspecified atrial fibrillation: Secondary | ICD-10-CM | POA: Diagnosis not present

## 2020-11-15 LAB — CULTURE, RESPIRATORY W GRAM STAIN: Culture: NORMAL

## 2020-11-15 LAB — CULTURE, BLOOD (SINGLE)
Culture: NO GROWTH
Special Requests: ADEQUATE

## 2020-11-15 MED ORDER — LEVOFLOXACIN 750 MG PO TABS: 750.0000 mg | ORAL_TABLET | Freq: Every day | ORAL | 0 refills | Status: AC

## 2020-11-15 NOTE — Patient Instructions (Signed)
It was very nice to see you today!  I will extend your course of antibiotics.  You should make sure that you are not having any fevers or chills for 24 to 48 hours before stopping your antibiotics.  I will place a referral for you to see a cardiologist.  Please send a message in a few weeks to let me know how you are doing.  Take care, Dr Jerline Pain  PLEASE NOTE:  If you had any lab tests please let us know if you have not heard back within a few days. You may see your results on mychart before we have a chance to review them but we will give you a call once they are reviewed by [place]. If we ordered any referrals today, please let us know if you have not heard from their office within the next week.   Please try these tips to maintain a healthy lifestyle:  Eat at least 3 REAL meals and 1-2 snacks per day.  Aim for no more than 5 hours between eating.  If you eat breakfast, please do so within one hour of getting up.   Each meal should contain half fruits/vegetables, one quarter protein, and one quarter carbs (no bigger than a computer mouse)  Cut down on sweet beverages. This includes juice, soda, and sweet tea.   Drink at least 1 glass of water with each meal and aim for at least 8 glasses per day  Exercise at least 150 minutes every week.

## 2020-11-15 NOTE — Assessment & Plan Note (Signed)
Possibly precipitated by his pneumonia.  No prior history of atrial fibrillation.  He is in A. fib today but rate controlled.  He has a chadsvasc score of 1.  He is currently on aspirin daily.  He will need further cardiac work-up.  We will place referral to cardiology.
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
## 2020-11-15 NOTE — Assessment & Plan Note (Signed)
We will extend course of Levaquin given that he is still having intermittent chills.  He will continue working on incentive spirometer and activity as tolerated.  We can repeat chest x-ray in several weeks.  Discussed typical course of recovery and that we will likely take several more weeks for him to feel back to 100% however he should be showing improvement day by day.

## 2020-12-07 DIAGNOSIS — R3915 Urgency of urination: Secondary | ICD-10-CM | POA: Diagnosis not present

## 2020-12-07 DIAGNOSIS — R3912 Poor urinary stream: Secondary | ICD-10-CM | POA: Diagnosis not present

## 2020-12-07 DIAGNOSIS — R351 Nocturia: Secondary | ICD-10-CM | POA: Diagnosis not present

## 2020-12-07 DIAGNOSIS — R350 Frequency of micturition: Secondary | ICD-10-CM | POA: Diagnosis not present

## 2020-12-07 DIAGNOSIS — N401 Enlarged prostate with lower urinary tract symptoms: Secondary | ICD-10-CM | POA: Diagnosis not present

## 2020-12-11 ENCOUNTER — Encounter: Payer: Self-pay | Admitting: Family Medicine

## 2021-01-12 ENCOUNTER — Telehealth: Payer: BC Managed Care – PPO | Admitting: Physician Assistant

## 2021-01-12 DIAGNOSIS — U071 COVID-19: Secondary | ICD-10-CM

## 2021-01-12 MED ORDER — NIRMATRELVIR/RITONAVIR (PAXLOVID)TABLET: 3.0000 | ORAL_TABLET | Freq: Two times a day (BID) | ORAL | 0 refills | Status: AC

## 2021-01-12 MED ORDER — ALBUTEROL SULFATE HFA 108 (90 BASE) MCG/ACT IN AERS: 2.0000 | INHALATION_SPRAY | Freq: Four times a day (QID) | RESPIRATORY_TRACT | 0 refills | Status: DC | PRN

## 2021-01-12 MED ORDER — BENZONATATE 100 MG PO CAPS: 100.0000 mg | ORAL_CAPSULE | Freq: Three times a day (TID) | ORAL | 0 refills | Status: DC | PRN

## 2021-01-12 NOTE — Patient Instructions (Signed)
Hello Clayton "Chuck",  You are being placed in the home monitoring program for COVID-19 (commonly known as Coronavirus).  This is because you are suspected to have the virus or are known to have the virus.  If you are unsure which group you fall into call your clinic.    As part of this program, you'll answer a daily questionnaire in the MyChart mobile app. You'll receive a notification through the MyChart app when the questionnaire is available. When you log in to MyChart, you'll see the tasks in your To Do activity.       Clinicians will see any answers that are concerning and take appropriate steps.  If at any point you are having a medical emergency, call 911.  If otherwise concerned call your clinic instead of coming into the clinic or hospital.  To keep from spreading the disease you should: Stay home and limit contact with other people as much as possible.  Wash your hands frequently. Cover your coughs and sneezes with a tissue, and throw used tissues in the trash.   Clean and disinfect frequently touched surfaces and objects.    Take care of yourself by: Staying home Resting Drinking fluids Take fever-reducing medications (Tylenol/Acetaminophen and Ibuprofen)  For more information on the disease go to the Centers for Disease Control and Prevention website     You are being prescribed PAXLOVID for COVID-19 infection.  Please call the pharmacy or go through the drive through vs going inside if you are picking up the mediation yourself to prevent further spread. If prescribed to a [facility] affiliated pharmacy, a pharmacist will bring the medication out to your car.   Medications to hold while taking this treatment: None  *If asked to hold, you can resume them 24 hours after your last dose   ADMINISTRATION INSTRUCTIONS: Take with or without food. Swallow the tablets whole. Don't chew, crush, or break the medications because it might not work as well  For each dose of the  medication, you should be taking 3 tablets together (2 pink oval and 1 white oval) TWICE a day for FIVE days   Finish your full five-day course of Paxlovid even if you feel better before you're done. Stopping this medication too early can make it less effective to prevent severe illness related to [place].    Paxlovid is prescribed for YOU ONLY. Don't share it with others, even if they have similar symptoms as you. This medication might not be right for everyone.  Make sure to take steps to protect yourself and others while you're taking this medication in order to get well soon and to prevent others from getting sick with COVID-19.  Paxlovid (nirmatrelvir / ritonavir) can cause hormonal birth control medications to not work well. If you or your partner is currently taking hormonal birth control, use condoms or other birth control methods to prevent unintended pregnancies.    COMMON SIDE EFFECTS: Altered or bad taste in your mouth  Diarrhea  High blood pressure (1% of people) Muscle aches (1% of people)     If your COVID-19 symptoms get worse, get medical help right away. Call 911 if you experience symptoms such as worsening cough, trouble breathing, chest pain that doesn't go away, confusion, a hard time staying awake, and pale or blue-colored skin. This medication won't prevent all COVID-19 cases from getting worse.   Can take to lessen severity: Vit C 500mg  twice daily Quercertin 250-500mg  twice daily Zinc 75-100mg  daily Melatonin 3-6 mg  at bedtime Vit D3 1000-2000 IU daily Aspirin 81 mg daily with food Optional: Famotidine 20mg  daily Also can add tylenol/ibuprofen as needed for fevers and body aches May add Mucinex or Mucinex DM as needed for cough/congestion  10 Things You Can Do to Manage Your COVID-19 Symptoms at Home If you have possible or confirmed COVID-19 Stay home except to get medical care. Monitor your symptoms carefully. If your symptoms get worse, call your  healthcare provider immediately. Get rest and stay hydrated. If you have a medical appointment, call the healthcare provider ahead of time and tell them that you have or may have COVID-19. For medical emergencies, call 911 and notify the dispatch personnel that you have or may have COVID-19. Cover your cough and sneezes with a tissue or use the inside of your elbow. Wash your hands often with soap and water for at least 20 seconds or clean your hands with an alcohol-based hand sanitizer that contains at least 60% alcohol. As much as possible, stay in a specific room and away from other people in your home. Also, you should use a separate bathroom, if available. If you need to be around other people in or outside of the home, wear a mask. Avoid sharing personal items with other people in your household, like dishes, towels, and bedding. Clean all surfaces that are touched often, like counters, tabletops, and doorknobs. Use household cleaning sprays or wipes according to the label instructions. [email or web address] 10/29/2019 This information is not intended to replace advice given to you by your health care provider. Make sure you discuss any questions you have with your health care provider. Document Revised: 08/17/2020 Document Reviewed: 08/17/2020 Elsevier Patient Education  [place].

## 2021-01-12 NOTE — Progress Notes (Signed)
Virtual Visit Consent   Clayton Freeman, you are scheduled for a virtual visit with a [facility] provider today.     Just as with appointments in the office, your consent must be obtained to participate.  Your consent will be active for this visit and any virtual visit you may have with one of our providers in the next 365 days.     If you have a MyChart account, a copy of this consent can be sent to you electronically.  All virtual visits are billed to your insurance company just like a traditional visit in the office.    As this is a virtual visit, video technology does not allow for your provider to perform a traditional examination.  This may limit your provider's ability to fully assess your condition.  If your provider identifies any concerns that need to be evaluated in person or the need to arrange testing (such as labs, EKG, etc.), we will make arrangements to do so.     Although advances in technology are sophisticated, we cannot ensure that it will always work on either your end or our end.  If the connection with a video visit is poor, the visit may have to be switched to a telephone visit.  With either a video or telephone visit, we are not always able to ensure that we have a secure connection.     I need to obtain your verbal consent now.   Are you willing to proceed with your visit today?    Clayton Freeman has provided verbal consent on 01/12/2021 for a virtual visit (video or telephone).   Clayton Daring, PA-C   Date: 01/12/2021 9:04 AM   Virtual Visit via Video Note   I, Clayton Freeman, connected with  Clayton Freeman  ([id number], 11/23/1959) on 01/12/21 at  8:30 AM EDT by a video-enabled telemedicine application and verified that I am speaking with the correct person using two identifiers.  Location: Patient: Virtual Visit Location Patient: Home Provider: Virtual Visit Location Provider: Home Office   I discussed the limitations of evaluation and management  by telemedicine and the availability of in person appointments. The patient expressed understanding and agreed to proceed.    History of Present Illness: Clayton Freeman is a 65 y.o. who identifies as a male who was assigned male at birth, and is being seen today for Covid 22.  HPI: URI  This is a new problem. Episode onset: symptoms started Tuesday, tested positive for covid 19 last night. The problem has been gradually worsening. Maximum temperature: wednesday and yesterday; unable to check temperature. Associated symptoms include congestion, coughing, diarrhea, headaches, nausea, rhinorrhea, sinus pain, a sore throat (more so with coughing) and wheezing. Pertinent negatives include no ear pain, plugged ear sensation or vomiting. Associated symptoms comments: Fatigue, post nasal drainage, shortness of breath with exertion. Treatments tried: claritin, zicam, airborne, coricidin hbp. The treatment provided no relief.   Had severe pneumonia in July 2022, hospitalized. Did have vaccine last week.   Problems:  Patient Active Problem List   Diagnosis Date Noted   New onset atrial fibrillation ([place]) 11/11/2020   Pneumonia of right lower lobe due to infectious organism 11/10/2020   Hyperglycemia 05/29/2020   Patellofemoral arthritis of right knee 10/13/2018   Morbid obesity ([place]) 02/13/2018   History of anaphylaxis 02/13/2018   Knee pain 02/13/2018   FAP (familial adenomatous polyposis)    Dyslipidemia 07/11/2015   Essential (primary) hypertension 04/15/2004    Allergies:  Allergies  Allergen Reactions   Bee Venom Anaphylaxis, Hives, Itching, Palpitations, Rash, Shortness Of Breath and Swelling   Penicillins Anaphylaxis, Hives, Itching, Palpitations, Rash, Shortness Of Breath and Swelling   Other     Horse Radish   Erythromycin Hives, Rash and Swelling   Medications:  Current Outpatient Medications:    albuterol (VENTOLIN HFA) 108 (90 Base) MCG/ACT inhaler, Inhale 2 puffs into the lungs  every 6 (six) hours as needed for wheezing or shortness of breath., Disp: 8 g, Rfl: 0   benzonatate (TESSALON) 100 MG capsule, Take 1 capsule (100 mg total) by mouth 3 (three) times daily as needed., Disp: 30 capsule, Rfl: 0   nirmatrelvir/ritonavir EUA (PAXLOVID) 20 x 150 MG & 10 x 100MG  TABS, Take 3 tablets by mouth 2 (two) times daily for 5 days. (Take nirmatrelvir 150 mg two tablets twice daily for 5 days and ritonavir 100 mg one tablet twice daily for 5 days) Patient GFR is over 60, Disp: 30 tablet, Rfl: 0   aspirin EC 81 MG tablet, Take 81 mg by mouth daily., Disp: , Rfl:    EPINEPHrine 0.3 mg/0.3 mL IJ SOAJ injection, Inject 0.3 mLs (0.3 mg total) into the muscle once as needed (anaphylaxis/allergic reaction)., Disp: 2 Device, Rfl: 0   guaiFENesin (MUCINEX) 600 MG 12 hr tablet, Take 1 tablet (600 mg total) by mouth 2 (two) times daily as needed for cough., Disp: 30 tablet, Rfl: 0   lisinopril (ZESTRIL) 40 MG tablet, Take 1 tablet (40 mg total) by mouth daily., Disp: 90 tablet, Rfl: 3   Multiple Vitamin (MULTIVITAMIN) tablet, Take 1 tablet by mouth daily., Disp: , Rfl:    Omega-3 Fatty Acids (FISH OIL) 1000 MG CAPS, Take 1,000 mg by mouth daily at 6 (six) AM., Disp: , Rfl:    VITAMIN E PO, Take 1 capsule by mouth daily., Disp: , Rfl:   Observations/Objective: Patient is well-developed, well-nourished in no acute distress.  Resting comfortably at home.  Head is normocephalic, atraumatic.  No labored breathing.  Speech is clear and coherent with logical content.  Patient is alert and oriented at baseline.    Assessment and Plan: 1. COVID-19 - MyChart COVID-19 home monitoring program; Future - nirmatrelvir/ritonavir EUA (PAXLOVID) 20 x 150 MG & 10 x 100MG  TABS; Take 3 tablets by mouth 2 (two) times daily for 5 days. (Take nirmatrelvir 150 mg two tablets twice daily for 5 days and ritonavir 100 mg one tablet twice daily for 5 days) Patient GFR is over 60  Dispense: 30 tablet; Refill: 0 -  albuterol (VENTOLIN HFA) 108 (90 Base) MCG/ACT inhaler; Inhale 2 puffs into the lungs every 6 (six) hours as needed for wheezing or shortness of breath.  Dispense: 8 g; Refill: 0 - benzonatate (TESSALON) 100 MG capsule; Take 1 capsule (100 mg total) by mouth 3 (three) times daily as needed.  Dispense: 30 capsule; Refill: 0  - Continue OTC symptomatic management of choice - Will send OTC vitamins and supplement information through AVS - Paxlovid prescribed - Albuterol and tessalon for symptomatic management - Patient enrolled in MyChart symptom monitoring - Push fluids - Rest as needed - Discussed return precautions and when to seek in-person evaluation, sent via AVS as well  Follow Up Instructions: I discussed the assessment and treatment plan with the patient. The patient was provided an opportunity to ask questions and all were answered. The patient agreed with the plan and demonstrated an understanding of the instructions.  A copy of instructions were sent  to the patient via MyChart unless otherwise noted below.    The patient was advised to call back or seek an in-person evaluation if the symptoms worsen or if the condition fails to improve as anticipated.  Time:  I spent 20 minutes with the patient via telehealth technology discussing the above problems/concerns.    Clayton Daring, PA-C

## 2021-01-17 ENCOUNTER — Telehealth: Payer: Self-pay | Admitting: *Deleted

## 2021-01-17 NOTE — Telephone Encounter (Signed)
Attempted to call patient-left message to call back MyChart- Companion : Increased symptom:cough  If cough is becoming worse even with the use of over the counter medications and patient is not able to sleep at night, cough becomes productive with sputum that maybe yellow or green in color- contact provider. Copy of note sent to patient in MyChart- for reference.

## 2021-01-19 ENCOUNTER — Telehealth: Payer: Self-pay | Admitting: *Deleted

## 2021-01-19 NOTE — Telephone Encounter (Signed)
Patient is calling to report he has lingering cough with chest congestion after COVID diagnosis and treatment with Paxlovid. Patient is presently still using Tessalon pearls for cough. Patient reports he is better- but the cough and milky white phlegm is concerning him. Advised patient to call PCP for follow up- he may need treatment for secondary infection. Patient states he will follow up with primary care provider- advised if not available- please do virtual visit with provider on line.

## 2021-01-22 ENCOUNTER — Encounter: Payer: Self-pay | Admitting: Family Medicine

## 2021-01-22 ENCOUNTER — Telehealth (INDEPENDENT_AMBULATORY_CARE_PROVIDER_SITE_OTHER): Payer: BC Managed Care – PPO | Admitting: Family Medicine

## 2021-01-22 VITALS — Ht 72.0 in | Wt 330.0 lb

## 2021-01-22 DIAGNOSIS — I10 Essential (primary) hypertension: Secondary | ICD-10-CM | POA: Diagnosis not present

## 2021-01-22 DIAGNOSIS — J329 Chronic sinusitis, unspecified: Secondary | ICD-10-CM | POA: Diagnosis not present

## 2021-01-22 DIAGNOSIS — U071 COVID-19: Secondary | ICD-10-CM

## 2021-01-22 MED ORDER — BENZONATATE 100 MG PO CAPS: 100.0000 mg | ORAL_CAPSULE | Freq: Three times a day (TID) | ORAL | 0 refills | Status: DC | PRN

## 2021-01-22 MED ORDER — AZELASTINE HCL 0.1 % NA SOLN: 2.0000 | Freq: Two times a day (BID) | NASAL | 12 refills | Status: DC

## 2021-01-22 MED ORDER — DOXYCYCLINE HYCLATE 100 MG PO TABS
100.0000 mg | ORAL_TABLET | Freq: Two times a day (BID) | ORAL | 0 refills | Status: DC
Start: 2021-01-22 — End: 2021-03-22

## 2021-01-22 NOTE — Assessment & Plan Note (Signed)
Higher risk of rebound covid due to other health conditions including obesity.

## 2021-01-22 NOTE — Assessment & Plan Note (Signed)
Stable on lisinopril 40 mg daily.

## 2021-01-22 NOTE — Progress Notes (Signed)
   Clayton Freeman is a 65 y.o. male who presents today for a virtual office visit.  Assessment/Plan:  New/Acute Problems: Sinusitis No red flags.  We will start Astelin and doxycycline.  He has a penicillin and erythromycin allergy.  Encourage good oral hydration.  We will also refill his Tessalon today.  Discussed reasons return to care.  Doubt this is rebound COVID though he will let us know if symptoms do not improve.  Chronic Problems Addressed Today: Essential (primary) hypertension Stable on lisinopril 40 mg daily.  Morbid obesity ([place]) Higher risk of rebound covid due to other health conditions including obesity.        Subjective:  HPI:  Patient with concerns for sinus infection. HE was diagnosed with covid 2 weeks ago. Thinks he acquired it while on a business trip in [place].  Had a virtual visit with different provider.  Started on paxlovid.  Symptoms had improved however over the last several days has had progressively worsening sinus congestion and cough.  Consistent with prior sinus infections.  No fevers or chills.  No shortness of breath.       Objective/Observations  Physical Exam: Gen: NAD, resting comfortably Pulm: Normal work of breathing Neuro: Grossly normal, moves all extremities Psych: Normal affect and thought content  Virtual Visit via Video   I connected with Clayton Freeman on 01/22/21 at 11:00 AM EDT by a video enabled telemedicine application and verified that I am speaking with the correct person using two identifiers. The limitations of evaluation and management by telemedicine and the availability of in person appointments were discussed. The patient expressed understanding and agreed to proceed.   Patient location: Home Provider location: [place] participating in the virtual visit: Myself and Patient     Algis Greenhouse. Jerline Pain, MD 01/22/2021 11:15 AM
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
## 2021-01-29 ENCOUNTER — Ambulatory Visit: Payer: BC Managed Care – PPO | Admitting: Interventional Cardiology

## 2021-01-29 ENCOUNTER — Encounter: Payer: Self-pay | Admitting: Interventional Cardiology

## 2021-01-29 ENCOUNTER — Encounter: Payer: Self-pay | Admitting: *Deleted

## 2021-01-29 ENCOUNTER — Other Ambulatory Visit: Payer: Self-pay

## 2021-01-29 VITALS — BP 122/68 | HR 87 | Ht 72.0 in | Wt 339.6 lb

## 2021-01-29 DIAGNOSIS — I10 Essential (primary) hypertension: Secondary | ICD-10-CM

## 2021-01-29 DIAGNOSIS — I4891 Unspecified atrial fibrillation: Secondary | ICD-10-CM | POA: Diagnosis not present

## 2021-01-29 DIAGNOSIS — R9431 Abnormal electrocardiogram [ECG] [EKG]: Secondary | ICD-10-CM

## 2021-01-29 DIAGNOSIS — E785 Hyperlipidemia, unspecified: Secondary | ICD-10-CM

## 2021-01-29 DIAGNOSIS — R0602 Shortness of breath: Secondary | ICD-10-CM

## 2021-01-29 DIAGNOSIS — I452 Bifascicular block: Secondary | ICD-10-CM

## 2021-01-29 NOTE — Patient Instructions (Addendum)
Medication Instructions:  .insttcu  *If you need a refill on your cardiac medications before your next appointment, please call your pharmacy*   Lab Work: None If you have labs (blood work) drawn today and your tests are completely normal, you will receive your results only by: [place] (if you have MyChart) OR A paper copy in the mail If you have any lab test that is abnormal or we need to change your treatment, we will call you to review the results.   Testing/Procedures: Your physician has requested that you have an echocardiogram. Echocardiography is a painless test that uses sound waves to create images of your heart. It provides your doctor with information about the size and shape of your heart and how well your heart's chambers and valves are working. This procedure takes approximately one hour. There are no restrictions for this procedure.  Your physician has recommended that you wear an event monitor. Event monitors are medical devices that record the heart's electrical activity. Doctors most often [place] these monitors to diagnose arrhythmias. Arrhythmias are problems with the speed or rhythm of the heartbeat. The monitor is a small, portable device. You can wear one while you do your normal daily activities. This is usually used to diagnose what is causing palpitations/syncope (passing out).   Follow-Up: At [facility], you and your health needs are our priority.  As part of our continuing mission to provide you with exceptional heart care, we have created designated Provider Care Teams.  These Care Teams include your primary Cardiologist (physician) and Advanced Practice Providers (APPs -  Physician Assistants and Nurse Practitioners) who all work together to provide you with the care you need, when you need it.  We recommend signing up for the patient portal called "MyChart".  Sign up information is provided on this After Visit Summary.  MyChart is used to connect with  patients for Virtual Visits (Telemedicine).  Patients are able to view lab/test results, encounter notes, upcoming appointments, etc.  Non-urgent messages can be sent to your provider as well.   To learn more about what you can do with MyChart, go to [email or web address].    Your next appointment:   As needed  The format for your next appointment:   In Person  Provider:   You may see Daneen Schick, III, MD or one of the following Advanced Practice Providers on your designated Care Team:   Cecilie Kicks, NP   Other Instructions  [employer] Cardiac Event Monitor Instructions Your physician has requested you wear your cardiac event monitor for _____ days, (1-30). [employer] may call or text to confirm a shipping address. The monitor will be sent to a land address via UPS. [employer] will not ship a monitor to a PO BOX. It typically takes 3-5 days to receive your monitor after it has been enrolled. [employer] will assist with USPS tracking if your package is delayed. The telephone number for [employer] is [phone]. Once you have received your monitor, please review the enclosed instructions. Instruction tutorials can also be viewed under help and settings on the enclosed cell phone. Your monitor has already been registered assigning a specific monitor serial # to you.  Applying the monitor Remove cell phone from case and turn it on. The cell phone works as Dealer and needs to be within [employer] of you at all times. The cell phone will need to be charged on a daily basis. We recommend you plug the cell phone into the enclosed charger at  your bedside table every night.  Monitor batteries: You will receive two monitor batteries labelled #1 and #2. These are your recorders. Plug battery #2 onto the second connection on the enclosed charger. Keep one battery on the charger at all times. This will keep the monitor battery deactivated. It will also keep it fully charged for when  you need to switch your monitor batteries. A small light will be blinking on the battery emblem when it is charging. The light on the battery emblem will remain on when the battery is fully charged.  Open package of a Monitor strip. Insert battery #1 into black hood on strip and gently squeeze monitor battery onto connection as indicated in instruction booklet. Set aside while preparing skin.  Choose location for your strip, vertical or horizontal, as indicated in the instruction booklet. Shave to remove all hair from location. There cannot be any lotions, oils, powders, or colognes on skin where monitor is to be applied. Wipe skin clean with enclosed Saline wipe. Dry skin completely.  Peel paper labeled #1 off the back of the Monitor strip exposing the adhesive. Place the monitor on the chest in the vertical or horizontal position shown in the instruction booklet. One arrow on the monitor strip must be pointing upward. Carefully remove paper labeled #2, attaching remainder of strip to your skin. Try not to create any folds or wrinkles in the strip as you apply it.  Firmly press and release the circle in the center of the monitor battery. You will hear a small beep. This is turning the monitor battery on. The heart emblem on the monitor battery will light up every 5 seconds if the monitor battery in turned on and connected to the patient securely. Do not push and hold the circle down as this turns the monitor battery off. The cell phone will locate the monitor battery. A screen will appear on the cell phone checking the connection of your monitor strip. This may read poor connection initially but change to good connection within the next minute. Once your monitor accepts the connection you will hear a series of 3 beeps followed by a climbing crescendo of beeps. A screen will appear on the cell phone showing the two monitor strip placement options. Touch the picture that demonstrates where you  applied the monitor strip.  Your monitor strip and battery are waterproof. You are able to shower, bathe, or swim with the monitor on. They just ask you do not submerge deeper than 3 feet underwater. We recommend removing the monitor if you are swimming in a lake, river, or ocean.  Your monitor battery will need to be switched to a fully charged monitor battery approximately once a week. The cell phone will alert you of an action which needs to be made.  On the cell phone, tap for details to reveal connection status, monitor battery status, and cell phone battery status. The green dots indicates your monitor is in good status. A red dot indicates there is something that needs your attention.  To record a symptom, click the circle on the monitor battery. In 30-60 seconds a list of symptoms will appear on the cell phone. Select your symptom and tap save. Your monitor will record a sustained or significant arrhythmia regardless of you clicking the button. Some patients do not feel the heart rhythm irregularities. [employer] will notify us of any serious or critical events.  Refer to instruction booklet for instructions on switching batteries, changing strips, the Do not  disturb or Pause features, or any additional questions.  Call [employer] at [phone], to confirm your monitor is transmitting and record your baseline. They will answer any questions you may have regarding the monitor instructions at that time.  Returning the monitor to [place] all equipment back into blue box. Peel off strip of paper to expose adhesive and close box securely. There is a prepaid UPS shipping label on this box. Drop in a UPS drop box, or at a UPS facility like [employer]. You may also contact [employer] to arrange UPS to pick up monitor package at your home.

## 2021-01-29 NOTE — Progress Notes (Signed)
Patient ID: Clayton Freeman, male   DOB: 29-Jan-1956, 65 y.o.   MRN: [id number] Patient enrolled for [employer] to ship a 30 day cardiac event monitor to his address on file.

## 2021-02-03 ENCOUNTER — Other Ambulatory Visit: Payer: Self-pay | Admitting: Physician Assistant

## 2021-02-03 DIAGNOSIS — U071 COVID-19: Secondary | ICD-10-CM

## 2021-02-06 ENCOUNTER — Ambulatory Visit (INDEPENDENT_AMBULATORY_CARE_PROVIDER_SITE_OTHER): Payer: BC Managed Care – PPO

## 2021-02-06 ENCOUNTER — Encounter: Payer: Self-pay | Admitting: Interventional Cardiology

## 2021-02-06 DIAGNOSIS — I4891 Unspecified atrial fibrillation: Secondary | ICD-10-CM | POA: Diagnosis not present

## 2021-02-09 ENCOUNTER — Telehealth: Payer: Self-pay | Admitting: *Deleted

## 2021-02-09 NOTE — Telephone Encounter (Signed)
   Cardiac Monitor Alert  Date of alert:  02/09/2021   Patient Name: Clayton Freeman  DOB: 1956-02-22  MRN: [id number]   [facility] Cardiologist: [facility] EP:  None    Monitor Information: Cardiac Event Monitor [[employer]]  Reason:  Needs 30-day monitor to exclude atrial fibrillation/detect A. fib burden.  He did have a flutter/A. fib/MAT Ordering provider:  Daneen Schick :1}  Alert Atrial Fibrillation/Flutter This is the 1st alert for this rhythm.  The patient has a hx of Atrial Fibrillation/Flutter.  The patient is not currently on anticoagulation.  Next Cardiology Appointment   As needed  The patient was contacted today.  He is symptomatic.  He reports the following symptoms:  shortness of breath and some fluttering. Patient was at home watch a sporting evert on TV.  Arrhythmia, symptoms and history reviewed with Dr. Harrington Challenger ([facility]).  Plan:  Atrial Fibrillation rhythm reported not definite, follow up with Dr. Tamala Julian after all testing, and continue to monitor.    [name], RN  02/09/2021 11:14 AM

## 2021-02-13 ENCOUNTER — Other Ambulatory Visit: Payer: Self-pay

## 2021-02-13 ENCOUNTER — Ambulatory Visit (HOSPITAL_COMMUNITY): Payer: BC Managed Care – PPO | Attending: Cardiology

## 2021-02-13 DIAGNOSIS — R0602 Shortness of breath: Secondary | ICD-10-CM | POA: Diagnosis not present

## 2021-02-13 LAB — ECHOCARDIOGRAM COMPLETE
Area-P 1/2: 3.53 cm2
S' Lateral: 5 cm

## 2021-02-14 ENCOUNTER — Telehealth: Payer: Self-pay | Admitting: Interventional Cardiology

## 2021-02-14 NOTE — Telephone Encounter (Signed)
Informed pt of results. Pt verbalized understanding. 

## 2021-02-14 NOTE — Telephone Encounter (Signed)
Patient returning call for echo results. He says to call back: [phone]

## 2021-02-15 ENCOUNTER — Telehealth: Payer: Self-pay | Admitting: Physician Assistant

## 2021-02-15 NOTE — Telephone Encounter (Signed)
  [employer] called because pt had indicated a syncopal episode.  During this, he was in ventricular bigeminy for a total of 36 ventricular beats  Called pt and spoke w/ him.  He did not pass out, hit the wrong button.  However, he was having palpitations, and having sx just like the ones he gets w/ Atrial fib.  Sx have resolved.   Advised him to eat food w/ K+ and Mg in it.  Will route this to Dr Tamala Julian.   Rosaria Ferries, PA-C 02/15/2021 7:33 PM

## 2021-02-16 NOTE — Telephone Encounter (Signed)
[employer] Fax received.   Arrhythmia, symptoms and history reviewed with Dr. Marlou Porch (DOD).  Plan: continue to monitor.

## 2021-02-18 ENCOUNTER — Telehealth: Payer: Self-pay | Admitting: Student

## 2021-02-18 NOTE — Telephone Encounter (Addendum)
   Received page from [facility] about critical EKG. Called and spoke with rep. Around 4pm, patient was in sinus rhythm and then started having frequent PVC (bigeminy) and then had a 14 beat run of NSVT with rates in the 160s before returning to sinus rhythm. This was an automatic alert. I called and spoke with patient. He states he feels OK. He has been doing work around the house and denies any significant palpitations. He had a little shortness but nothing significant. He reports occasional very mild chest discomfort which does not sound new and he attributes this to his weight. However, no chest pain around the time of these events. No syncope. Will wait for full monitor results before making any medication changes. Discussed signs/symptoms of when to go to the ED including acute chest pain, severe shortness of breath, syncope, etc. Patient voiced understanding and thanked me for calling to check on him.  Darreld Mclean, PA-C 02/18/2021 5:49 PM

## 2021-02-19 ENCOUNTER — Telehealth: Payer: Self-pay

## 2021-02-19 ENCOUNTER — Encounter: Payer: Self-pay | Admitting: Family Medicine

## 2021-02-19 NOTE — Telephone Encounter (Signed)
   Cardiac Monitor Alert  Date of alert:  02/19/2021   Patient Name: Clayton Freeman  DOB: 03-29-1956  MRN: [id number]   [place] Cardiologist: [name], MD  [facility] EP:  None    Monitor Information: Cardiac Event Monitor [[employer]]  Reason:  Afib Ordering provider:  Dr Daneen Schick   Alert Ventricular Tachycardia This is the 1st alert for this rhythm.   Next Cardiology Appointment   Date:  none scheduled at this time with Dr Tamala Julian  The patient was contacted today by Sande Rives, PA and was asymptomatic. Arrhythmia, symptoms and history reviewed with Dr Tamala Julian.   Plan:  Continue monitoring per Dr Georgia Lopes, RN  02/19/2021 10:39 AM

## 2021-02-21 ENCOUNTER — Other Ambulatory Visit: Payer: Self-pay

## 2021-02-21 DIAGNOSIS — R0602 Shortness of breath: Secondary | ICD-10-CM

## 2021-02-26 ENCOUNTER — Telehealth: Payer: Self-pay

## 2021-02-26 MED ORDER — METOPROLOL SUCCINATE ER 25 MG PO TB24: 25.0000 mg | ORAL_TABLET | Freq: Every day | ORAL | 11 refills | Status: DC

## 2021-02-26 NOTE — Telephone Encounter (Signed)
   Cardiac Monitor Alert  Date of alert:  02/24/2021- received 02/26/2021   Patient Name: Clayton Freeman  DOB: 02/05/1956  MRN: [id number]   [place] Cardiologist: [name], MD  [facility] EP:  None    Monitor Information: Cardiac Event Monitor [[employer]]  Reason:  afib Ordering provider:  Dr. Tamala Julian   Alert Ventricular Tachycardia This is the 2nd alert for this rhythm.   Next Cardiology Appointment   No follow up at this time  The patient could NOT be reached by telephone today.  02/26/2021  Will send MyChart message Damian Leavell, RN  02/26/2021 10:36 AM

## 2021-02-26 NOTE — Telephone Encounter (Signed)
2nd outreach.  Unable to leave a message.  Per Dr. Tamala Julian-  START Toprol 25 mg PO daily-sent to pharmacy F/u in December with app-scheduled  Sent detailed mychart message.

## 2021-02-26 NOTE — Telephone Encounter (Signed)
Call back received from Pt.  Pt does not recall symptoms on 02/24/21.    Thinks he did not feel well on Sunday 02/25/21, but no specific symptoms.  Gave Dr. Thompson Caul recommendations.  He indicates understanding.

## 2021-03-12 ENCOUNTER — Other Ambulatory Visit: Payer: Self-pay | Admitting: *Deleted

## 2021-03-12 ENCOUNTER — Telehealth: Payer: Self-pay

## 2021-03-12 NOTE — Telephone Encounter (Signed)
Pt stated that he received a call from [place]. I do not see documentation. Pt stated that he was referred to Sleep Medicine. Pt stated that he is unable to schedule an appt until Dr Jerline Pain puts in an order. Pt would like a call. Please Advise.

## 2021-03-13 ENCOUNTER — Telehealth: Payer: Self-pay | Admitting: *Deleted

## 2021-03-13 ENCOUNTER — Other Ambulatory Visit: Payer: Self-pay

## 2021-03-13 DIAGNOSIS — G4730 Sleep apnea, unspecified: Secondary | ICD-10-CM

## 2021-03-13 DIAGNOSIS — N401 Enlarged prostate with lower urinary tract symptoms: Secondary | ICD-10-CM | POA: Diagnosis not present

## 2021-03-13 DIAGNOSIS — N5201 Erectile dysfunction due to arterial insufficiency: Secondary | ICD-10-CM | POA: Diagnosis not present

## 2021-03-13 DIAGNOSIS — R351 Nocturia: Secondary | ICD-10-CM | POA: Diagnosis not present

## 2021-03-13 NOTE — Telephone Encounter (Signed)
Patient returning call.

## 2021-03-13 NOTE — Telephone Encounter (Signed)
   Cardiac Monitor Alert  Date of alert:  03/02/2021   Patient Name: Clayton Freeman  DOB: May 31, 1955  MRN: [id number]   [place] [facility] Cardiologist: [name], MD  [facility] EP:  None    Monitor Information: Cardiac Event Monitor [[employer]]  Reason: Atrial Fibrillation Ordering provider:  Dr. Tamala Julian :1}  Alert Ventricular Tachycardia PVCs - 28 in 1 minute  This is the 3rd alert for this rhythm.   Next Cardiology Appointment   Date:  03/22/21  Provider:  C. Walker  The patient could NOT be reached by telephone today.  Left message to call back . Patient called back and this report was from 11 days ago so he doesn't remember symptoms for that day. He states when these occur he normally has SOB.  Arrhythmia, symptoms and history reviewed with DOD Dr. Harrington Challenger.   Plan:  Continue as planned with follow up and metoprolol.    Other: Previous plan to start Metoprolol Succinate 25 mg QD (02/26/21) and follow up made that is listed above.   [name], RN  03/13/2021 9:47 AM

## 2021-03-14 NOTE — Telephone Encounter (Signed)
Order has been placed.
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
## 2021-03-22 ENCOUNTER — Encounter (HOSPITAL_BASED_OUTPATIENT_CLINIC_OR_DEPARTMENT_OTHER): Payer: Self-pay | Admitting: Nurse Practitioner

## 2021-03-22 ENCOUNTER — Ambulatory Visit (HOSPITAL_BASED_OUTPATIENT_CLINIC_OR_DEPARTMENT_OTHER): Payer: BC Managed Care – PPO | Admitting: Nurse Practitioner

## 2021-03-22 ENCOUNTER — Other Ambulatory Visit: Payer: Self-pay

## 2021-03-22 VITALS — BP 132/74 | HR 43 | Ht 72.0 in | Wt 330.0 lb

## 2021-03-22 DIAGNOSIS — E876 Hypokalemia: Secondary | ICD-10-CM

## 2021-03-22 DIAGNOSIS — I5042 Chronic combined systolic (congestive) and diastolic (congestive) heart failure: Secondary | ICD-10-CM

## 2021-03-22 DIAGNOSIS — I4891 Unspecified atrial fibrillation: Secondary | ICD-10-CM | POA: Diagnosis not present

## 2021-03-22 DIAGNOSIS — I10 Essential (primary) hypertension: Secondary | ICD-10-CM

## 2021-03-22 DIAGNOSIS — R079 Chest pain, unspecified: Secondary | ICD-10-CM

## 2021-03-22 DIAGNOSIS — G4733 Obstructive sleep apnea (adult) (pediatric): Secondary | ICD-10-CM

## 2021-03-22 DIAGNOSIS — I493 Ventricular premature depolarization: Secondary | ICD-10-CM

## 2021-03-22 NOTE — Patient Instructions (Addendum)
Medication Instructions:  Your physician recommends that you continue on your current medications as directed. Please refer to the Current Medication list given to you today.  *If you need a refill on your cardiac medications before your next appointment, please call your pharmacy*   Lab Work: TODAY - basic metabolic panel, magnesium level  If you have labs (blood work) drawn today and your tests are completely normal, you will receive your results only by: [place] (if you have MyChart) OR A paper copy in the mail If you have any lab test that is abnormal or we need to change your treatment, we will call you to review the results.   Testing/Procedures: None Ordered   Follow-Up: You have been referred to  see one of our Optometrist at our [employer]. You will receive a call regarding an appointment.   At [facility], you and your health needs are our priority.  As part of our continuing mission to provide you with exceptional heart care, we have created designated Provider Care Teams.  These Care Teams include your primary Cardiologist (physician) and Advanced Practice Providers (APPs -  Physician Assistants and Nurse Practitioners) who all work together to provide you with the care you need, when you need it.   Your next appointment:   To be determined following your visit with EP   The format for your next appointment:   In Person  Provider:   [name], MD  or Advanced Practice Provider (NP or PA)

## 2021-03-23 ENCOUNTER — Encounter (HOSPITAL_BASED_OUTPATIENT_CLINIC_OR_DEPARTMENT_OTHER): Payer: Self-pay

## 2021-03-23 LAB — MAGNESIUM: Magnesium: 1.8 mg/dL (ref 1.6–2.3)

## 2021-03-23 LAB — BASIC METABOLIC PANEL
BUN/Creatinine Ratio: 13 (ref 10–24)
BUN: 11 mg/dL (ref 8–27)
CO2: 24 mmol/L (ref 20–29)
Calcium: 9.5 mg/dL (ref 8.6–10.2)
Chloride: 102 mmol/L (ref 96–106)
Creatinine, Ser: 0.83 mg/dL (ref 0.76–1.27)
Glucose: 113 mg/dL — ABNORMAL HIGH (ref 70–99)
Potassium: 4.4 mmol/L (ref 3.5–5.2)
Sodium: 140 mmol/L (ref 134–144)
eGFR: 98 mL/min/{1.73_m2} (ref >59)

## 2021-03-23 NOTE — Progress Notes (Signed)
Seen by pt. 12/9

## 2021-04-03 ENCOUNTER — Other Ambulatory Visit: Payer: Self-pay | Admitting: *Deleted

## 2021-04-03 MED ORDER — METOPROLOL SUCCINATE ER 50 MG PO TB24: 50.0000 mg | ORAL_TABLET | Freq: Every day | ORAL | 3 refills | Status: DC

## 2021-04-18 ENCOUNTER — Other Ambulatory Visit: Payer: Self-pay

## 2021-04-18 ENCOUNTER — Ambulatory Visit (HOSPITAL_BASED_OUTPATIENT_CLINIC_OR_DEPARTMENT_OTHER): Payer: BC Managed Care – PPO | Attending: Family Medicine | Admitting: Internal Medicine

## 2021-04-18 DIAGNOSIS — G4730 Sleep apnea, unspecified: Secondary | ICD-10-CM

## 2021-04-18 DIAGNOSIS — R5383 Other fatigue: Secondary | ICD-10-CM | POA: Insufficient documentation

## 2021-04-18 DIAGNOSIS — R0683 Snoring: Secondary | ICD-10-CM | POA: Insufficient documentation

## 2021-04-18 DIAGNOSIS — I509 Heart failure, unspecified: Secondary | ICD-10-CM | POA: Diagnosis not present

## 2021-04-18 DIAGNOSIS — G4733 Obstructive sleep apnea (adult) (pediatric): Secondary | ICD-10-CM

## 2021-04-18 DIAGNOSIS — I110 Hypertensive heart disease with heart failure: Secondary | ICD-10-CM | POA: Insufficient documentation

## 2021-04-18 DIAGNOSIS — Z6841 Body Mass Index (BMI) 40.0 and over, adult: Secondary | ICD-10-CM | POA: Diagnosis not present

## 2021-04-29 DIAGNOSIS — G4730 Sleep apnea, unspecified: Secondary | ICD-10-CM

## 2021-04-29 NOTE — Procedures (Signed)
° ° °  Patient Name: Clayton Freeman, Clayton Freeman Date: 04/18/2021 Gender: Male D.O.B: 1955/06/29 Age (years): 65 Referring Provider: Vivi Barrack Height (inches): 69 Interpreting Physician: Baird Lyons MD, ABSM Weight (lbs): 340 RPSGT: Carolin Coy BMI: 59 MRN: [id number] Neck Size: 20.00  CLINICAL INFORMATION Sleep Study Type: NPSG Indication for sleep study: Congestive Heart Failure, Daytime Fatigue, Fatigue, Hypertension, Morbid Obesity, Obesity, Snoring Epworth Sleepiness Score: 15  SLEEP STUDY TECHNIQUE As per the AASM Manual for the Scoring of Sleep and Associated Events v2.3 (April 2016) with a hypopnea requiring 4% desaturations.  The channels recorded and monitored were frontal, central and occipital EEG, electrooculogram (EOG), submentalis EMG (chin), nasal and oral airflow, thoracic and abdominal wall motion, anterior tibialis EMG, snore microphone, electrocardiogram, and pulse oximetry.  MEDICATIONS Medications self-administered by patient taken the night of the study : none reported  SLEEP ARCHITECTURE The study was initiated at 11:00:18 PM and ended at 5:00:50 AM.  Sleep onset time was 18.6 minutes and the sleep efficiency was 56.2%. The total sleep time was 202.5 minutes.  Stage REM latency was 330.5 minutes.  The patient spent 21.98% of the night in stage N1 sleep, 73.83% in stage N2 sleep, 0.00% in stage N3 and 4.2% in REM.  Alpha intrusion was absent.  Supine sleep was 0.00%.  RESPIRATORY PARAMETERS The overall apnea/hypopnea index (AHI) was 90.1 per hour. There were 22 total apneas, including 22 obstructive, 0 central and 0 mixed apneas. There were 282 hypopneas and 19 RERAs.  The AHI during Stage REM sleep was 49.4 per hour.  AHI while supine was N/A per hour.  The mean oxygen saturation was 89.57%. The minimum SpO2 during sleep was 71.00%.  moderate snoring was noted during this study.  CARDIAC DATA The 2 lead EKG demonstrated sinus rhythm. The  mean heart rate was 77.36 beats per minute. Other EKG findings include: PVCs.  LEG MOVEMENT DATA The total PLMS were 0 with a resulting PLMS index of 0.00. Associated arousal with leg movement index was 0.0 .  IMPRESSIONS - Severe obstructive sleep apnea occurred during this study (AHI = 90.1/h). - Severe oxygen desaturation was noted during this study (Min O2 = 71.00%).Mean O2 saturation 90.2%. Time with O2 saturation 88% or less was 82.7 minutes. - The patient snored with moderate snoring volume. - EKG findings include PVCs. - Clinically significant periodic limb movements did not occur during sleep. No significant associated arousals.  DIAGNOSIS - Obstructive Sleep Apnea (G47.33) - Nocturnal Hypoxemia (G47.36)  RECOMMENDATIONS - Suggest return for CPAP titration or autopap. Other options would be based on clinical judgment. - Be careful with  alcohol, sedatives and other CNS depressants that may worsen sleep apnea and disrupt normal sleep architecture. - Sleep hygiene should be reviewed to assess factors that may improve sleep quality. - Weight management and regular exercise should be initiated or continued if appropriate.  [Electronically signed] 04/29/2021 11:54 AM  Baird Lyons MD, ABSM Diplomate, American Board of Sleep Medicine   NPI: [id number]                         [place] [place] [facility]  ELECTRONICALLY SIGNED ON:  04/29/2021, 11:51 AM [place] PH: [phone]   FX: [phone] [place]

## 2021-05-01 ENCOUNTER — Encounter: Payer: Self-pay | Admitting: Family Medicine

## 2021-05-01 ENCOUNTER — Other Ambulatory Visit: Payer: Self-pay | Admitting: *Deleted

## 2021-05-01 DIAGNOSIS — G4730 Sleep apnea, unspecified: Secondary | ICD-10-CM

## 2021-05-01 NOTE — Telephone Encounter (Signed)
See note

## 2021-05-15 ENCOUNTER — Encounter: Payer: Self-pay | Admitting: Cardiology

## 2021-05-15 ENCOUNTER — Ambulatory Visit (INDEPENDENT_AMBULATORY_CARE_PROVIDER_SITE_OTHER): Payer: BC Managed Care – PPO

## 2021-05-15 ENCOUNTER — Ambulatory Visit: Payer: BC Managed Care – PPO | Admitting: Cardiology

## 2021-05-15 ENCOUNTER — Other Ambulatory Visit: Payer: Self-pay

## 2021-05-15 VITALS — BP 100/68 | HR 83 | Ht 72.0 in | Wt 336.0 lb

## 2021-05-15 DIAGNOSIS — I480 Paroxysmal atrial fibrillation: Secondary | ICD-10-CM

## 2021-05-15 DIAGNOSIS — G4733 Obstructive sleep apnea (adult) (pediatric): Secondary | ICD-10-CM

## 2021-05-15 DIAGNOSIS — I493 Ventricular premature depolarization: Secondary | ICD-10-CM

## 2021-05-15 DIAGNOSIS — I5022 Chronic systolic (congestive) heart failure: Secondary | ICD-10-CM | POA: Diagnosis not present

## 2021-05-15 MED ORDER — MEXILETINE HCL 250 MG PO CAPS: 250.0000 mg | ORAL_CAPSULE | Freq: Two times a day (BID) | ORAL | 3 refills | Status: DC

## 2021-05-15 NOTE — Progress Notes (Unsigned)
Enrolled for [employer] to mail a ZIO XT long term holter monitor to the patients address on file.  
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
## 2021-05-15 NOTE — Patient Instructions (Addendum)
Medication Instructions:  Your physician has recommended you make the following change in your medication:  START Mexiletine 250 mg twice daily  *If you need a refill on your cardiac medications before your next appointment, please call your pharmacy*   Lab Work: None ordered   Testing/Procedures: In 2 months:   (complete instructions down below under other instructions)  Your physician has recommended that you wear a 3 day(s)/ week(s) heart monitor (ZIO patch) - This will be mailed directly to your home address/ placed in office today - You may receive a call directly from the company for [place], which is [employer] within the next few day- if you see an 800# or 224# calling, please answer - Once the monitor is applied and activated it will record your heart rate/ rhythm for the duration that you are wearing it - If you are having any symptoms (dizziness/ lightheadedness/ palpitations/ racing heart/ anything that just doesn't feel right) then you will push the button in the center of the monitor to mark you were having a symptom - Please DO NOT shower for 24 hours after the monitor is placed - No tub baths/ swimming pools/ hot tubs while wearing the monitor - Do not put any lotions, oils, or ointments around the monitor - If you have any issues with the monitor itself, then please call the 800 # for the company - After 3 day(s)/ week(s) please take the monitor off at home and mail it back ([place] mail) in the box provided by [employer], postage is already paid.    Follow-Up: At [facility], you and your health needs are our priority.  As part of our continuing mission to provide you with exceptional heart care, we have created designated Provider Care Teams.  These Care Teams include your primary Cardiologist (physician) and Advanced Practice Providers (APPs -  Physician Assistants and Nurse Practitioners) who all work together to provide you with the care you need, when you need it.  Your next  appointment:   3 month(s)  The format for your next appointment:   In Person  Provider:   Allegra Lai, MD    Thank you for choosing [facility]!!   Trinidad Curet, RN [phone]   Other Instructions                             ZIO XT- Long Term Monitor Instructions  Your physician has requested you wear a ZIO patch monitor for 3 days.  This is a single patch monitor. [employer] supplies one patch monitor per enrollment. Additional stickers are not available. Please do not apply patch if you will be having a Nuclear Stress Test,  Echocardiogram, Cardiac CT, MRI, or Chest Xray during the period you would be wearing the  monitor. The patch cannot be worn during these tests. You cannot remove and re-apply the  ZIO XT patch monitor.  Your ZIO patch monitor will be mailed 3 day USPS to your address on file. It may take 3-5 days  to receive your monitor after you have been enrolled.  Once you have received your monitor, please review the enclosed instructions. Your monitor  has already been registered assigning a specific monitor serial # to you.  Billing and Patient Assistance Program Information  We have supplied [employer] with any of your insurance information on file for billing purposes. [employer] offers a sliding scale Patient Assistance Program for patients that do not have  insurance,  or whose insurance does not completely cover the cost of the ZIO monitor.  You must apply for the Patient Assistance Program to qualify for this discounted rate.  To apply, please call [employer] at [phone], select option 4, select option 2, ask to apply for  Patient Assistance Program. Theodore Demark will ask your household income, and how many people  are in your household. They will quote your out-of-pocket cost based on that information.  [employer] will also be able to set up a 55-month, interest-free payment plan if needed.  Applying the monitor   Shave hair from upper left chest.  Hold  abrader disc by orange tab. Rub abrader in 40 strokes over the upper left chest as  indicated in your monitor instructions.  Clean area with 4 enclosed alcohol pads. Let dry.  Apply patch as indicated in monitor instructions. Patch will be placed under collarbone on left  side of chest with arrow pointing upward.  Rub patch adhesive wings for 2 minutes. Remove white label marked "1". Remove the white  label marked "2". Rub patch adhesive wings for 2 additional minutes.  While looking in a mirror, press and release button in center of patch. A small green light will  flash 3-4 times. This will be your only indicator that the monitor has been turned on.  Do not shower for the first 24 hours. You may shower after the first 24 hours.  Press the button if you feel a symptom. You will hear a small click. Record Date, Time and  Symptom in the Patient Logbook.  When you are ready to remove the patch, follow instructions on the last 2 pages of Patient  Logbook. Stick patch monitor onto the last page of Patient Logbook.  Place Patient Logbook in the blue and white box. Use locking tab on box and tape box closed  securely. The blue and white box has prepaid postage on it. Please place it in the mailbox as  soon as possible. Your physician should have your test results approximately 7 days after the  monitor has been mailed back to [facility].  Call [place] at [phone] if you have questions regarding  your ZIO XT patch monitor. Call them immediately if you see an orange light blinking on your  monitor.  If your monitor falls off in less than 4 days, contact our Monitor department at [phone].  If your monitor becomes loose or falls off after 4 days call [employer] at [phone] for  suggestions on securing your monitor

## 2021-05-18 ENCOUNTER — Encounter: Payer: Self-pay | Admitting: Cardiology

## 2021-05-28 NOTE — Telephone Encounter (Signed)
Left message to call back  

## 2021-06-20 ENCOUNTER — Other Ambulatory Visit: Payer: Self-pay

## 2021-06-20 ENCOUNTER — Encounter: Payer: Self-pay | Admitting: Pulmonary Disease

## 2021-06-20 ENCOUNTER — Ambulatory Visit (INDEPENDENT_AMBULATORY_CARE_PROVIDER_SITE_OTHER): Payer: BC Managed Care – PPO | Admitting: Pulmonary Disease

## 2021-06-20 VITALS — BP 124/64 | HR 72 | Ht 70.5 in | Wt 336.4 lb

## 2021-06-20 DIAGNOSIS — G4733 Obstructive sleep apnea (adult) (pediatric): Secondary | ICD-10-CM | POA: Diagnosis not present

## 2021-06-20 NOTE — Progress Notes (Signed)
? ?[facility] ? ?Chief Complaint  ?Patient presents with  ? Consult  ?  Sleep consult  ? ? ?Past Surgical History:  ?He  has a past surgical history that includes Rotator cuff repair and Chest tube insertion (Left, 2017). ? ?Past Medical History:  ?HLD, Familial polyposis, HTN, BPH, PAF, Combined CHF ? ?Constitutional:  ?BP 124/64 (BP Location: Left Arm, Cuff Size: Large)   Pulse 72   Ht 5' 10.5" (1.791 m)   Wt (!) 336 lb 6.4 oz (152.6 kg)   SpO2 97%   BMI 47.59 kg/m?  ? ?Brief Summary:  ?Clayton Freeman is a 66 y.o. male with obstructive sleep apnea. ?  ? ? ? ?Subjective:  ? ?His wife has been concerned about his snoring.  He stops breathing when he sleeps on his back.  His dentist told him he grinds his teeth.  He was found to have heart failure and a fib, and cardiology was concerned he might have sleep apnea.  He had sleep study in January that showed severe obstructive sleep apnea.   ? ?He goes to bed between 11 and 1 am.  He falls asleep quickly, but sometimes wakes up with a jerk because he stopped breathing.  He sleeps for an hour and then has to use the bathroom.  He gets up around 7 am.  He feels like he needs to stay in bed.  He gets headaches during the day.  He drinks coffee in the morning.  Not using anything to help sleep.  He has trouble staying awake when watching TV. ? ?He denies sleep walking, sleep talking, or nightmares.  There is no history of restless legs.  He denies sleep hallucinations, sleep paralysis, or cataplexy. ? ?The Epworth score is 15 out of 24. ? ? ?Physical Exam:  ? ?Appearance - well kempt  ? ?ENMT - no sinus tenderness, no oral exudate, no LAN, Mallampati 4 airway, no stridor ? ?Respiratory - equal breath sounds bilaterally, no wheezing or rales ? ?CV - s1s2 regular rate and rhythm, no murmurs ? ?Ext - no clubbing, no edema ? ?Skin - no rashes ? ?Psych - normal mood and affect ?  ?Sleep Tests:  ?PSG 04/18/21 >> AHI 90.1 SpO2 low  71% ? ?Cardiac Tests:  ?Echo 02/13/21 >> EF 45%, mild LVH, grade 2 DD, aortic root 41 mm ? ?Social History:  ?He  reports that he has quit smoking. He has never used smokeless tobacco. He reports current alcohol use. He reports that he does not use drugs. ? ?Family History:  ?His family history includes Colonic polyp in his father; Coronary artery disease in his paternal grandfather; Diverticulitis in his father; Hypertension in his father and mother; Prostate cancer in his father. ?  ? ? ?Assessment/Plan:  ? ?Snoring with excessive daytime sleepiness with severe obstructive sleep apnea. ?- reviewed his sleep study results ?- discussed how sleep apnea can impact his health ?- treatment options reviewed ?- given the severity of his sleep apnea CPAP therapy is best option to start with ?- will arrange for [employer] auto CPAP 5 to 20 cm H2O ?- will arrange for overnight oximetry on CPAP ?- discussed options to assist with mask fit ? ?Paroxysmal atrial fibrillation, Chronic combined CHF ?- followed by Dr. Allegra Lai with EP cardiology ? ?Obesity. ?- discussed how weight can impact sleep and risk for sleep disordered breathing ?- discussed options to assist with weight loss: combination of diet modification, cardiovascular and  strength training exercises ? ?Safe driving practices. ?- discussed how sleep disruption can increase risk of accidents, particularly when driving ?- safe driving practices were discussed ? ?Time Spent Involved in Patient Care on Day of Examination:  ?36 minutes ? ?Follow up:  ? ?Patient Instructions  ?Will arrange for auto CPAP set up and then arrange for overnight oxygen test with you using CPAP ? ?Follow up in 4 to 5 months ? ?Medication List:  ? ?Allergies as of 06/20/2021   ? ?   Reactions  ? Bee Venom Anaphylaxis, Hives, Itching, Palpitations, Rash, Shortness Of Breath, Swelling  ? Penicillins Anaphylaxis, Hives, Itching, Palpitations, Rash, Shortness Of Breath, Swelling  ? Other   ? Horse  Radish  ? Erythromycin Hives, Rash, Swelling  ? ?  ? ?  ?Medication List  ?  ? ?  ? Accurate as of June 20, 2021  4:04 PM. If you have any questions, ask your nurse or doctor.  ?  ?  ? ?  ? ?albuterol 108 (90 Base) MCG/ACT inhaler ?Commonly known as: VENTOLIN HFA ?Inhale 2 puffs into the lungs every 6 (six) hours as needed for wheezing or shortness of breath. ?  ?aspirin EC 81 MG tablet ?Take 81 mg by mouth daily. ?  ?EPINEPHrine 0.3 mg/0.3 mL Soaj injection ?Commonly known as: EPI-PEN ?Inject 0.3 mLs (0.3 mg total) into the muscle once as needed (anaphylaxis/allergic reaction). ?  ?Fish Oil 1000 MG Caps ?Take 1,000 mg by mouth daily at 6 (six) AM. ?  ?lisinopril 40 MG tablet ?Commonly known as: ZESTRIL ?Take 1 tablet (40 mg total) by mouth daily. ?  ?metoprolol succinate 50 MG 24 hr tablet ?Commonly known as: TOPROL-XL ?Take 1 tablet (50 mg total) by mouth daily. Take with or immediately following a meal. ?  ?mexiletine 250 MG capsule ?Commonly known as: MEXITIL ?Take 1 capsule (250 mg total) by mouth 2 (two) times daily. ?  ?multivitamin tablet ?Take 1 tablet by mouth daily. ?  ?tamsulosin 0.4 MG Caps capsule ?Commonly known as: FLOMAX ?Take 0.4 mg by mouth daily. ?  ?VITAMIN E PO ?Take 1 capsule by mouth daily. ?  ? ?  ? ? ?Signature:  ?Chesley Mires, MD ?[place] ?Pager - [phone] - [phone] ?06/20/2021, 4:04 PM ?  ? ? ? ? ? ? ? ? ?

## 2021-06-20 NOTE — Patient Instructions (Signed)
Will arrange for auto CPAP set up and then arrange for overnight oxygen test with you using CPAP ? ?Follow up in 4 to 5 months ?

## 2021-07-17 DIAGNOSIS — I493 Ventricular premature depolarization: Secondary | ICD-10-CM

## 2021-07-20 DIAGNOSIS — G4733 Obstructive sleep apnea (adult) (pediatric): Secondary | ICD-10-CM | POA: Diagnosis not present

## 2021-07-27 ENCOUNTER — Other Ambulatory Visit: Payer: Self-pay | Admitting: Family Medicine

## 2021-08-10 ENCOUNTER — Telehealth: Payer: Self-pay | Admitting: Cardiology

## 2021-08-10 MED ORDER — MEXILETINE HCL 150 MG PO CAPS: 300.0000 mg | ORAL_CAPSULE | Freq: Two times a day (BID) | ORAL | 3 refills | Status: DC

## 2021-08-10 NOTE — Telephone Encounter (Signed)
? ?  Pt is returning call to get heart monitor result. Pt said to call his work# [phone] ?

## 2021-08-10 NOTE — Telephone Encounter (Signed)
The patient has been notified of the result and verbalized understanding.  All questions (if any) were answered. ?Precious Gilding, RN 08/10/2021 9:53 AM   ?Notified pt of heart monitor results and MD recommendation to increase mexiletine to 300 mg PO BID.  Pt request 90 day supply.  No further questions or concerns.  ?

## 2021-08-19 DIAGNOSIS — G4733 Obstructive sleep apnea (adult) (pediatric): Secondary | ICD-10-CM | POA: Diagnosis not present

## 2021-08-21 ENCOUNTER — Ambulatory Visit: Payer: BC Managed Care – PPO | Admitting: Cardiology

## 2021-08-21 ENCOUNTER — Encounter: Payer: Self-pay | Admitting: Cardiology

## 2021-08-21 VITALS — BP 108/74 | HR 78 | Ht 72.0 in | Wt 339.6 lb

## 2021-08-21 DIAGNOSIS — I493 Ventricular premature depolarization: Secondary | ICD-10-CM
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
## 2021-09-19 DIAGNOSIS — G4733 Obstructive sleep apnea (adult) (pediatric): Secondary | ICD-10-CM | POA: Diagnosis not present

## 2021-10-07 IMAGING — DX DG CHEST 1V PORT
1 series · 1 of 1 positions shown · non-contrast
Comparison: None.

CLINICAL DATA: Shortness of breath, cough, chills

EXAM:
PORTABLE CHEST 1 VIEW

[chest]
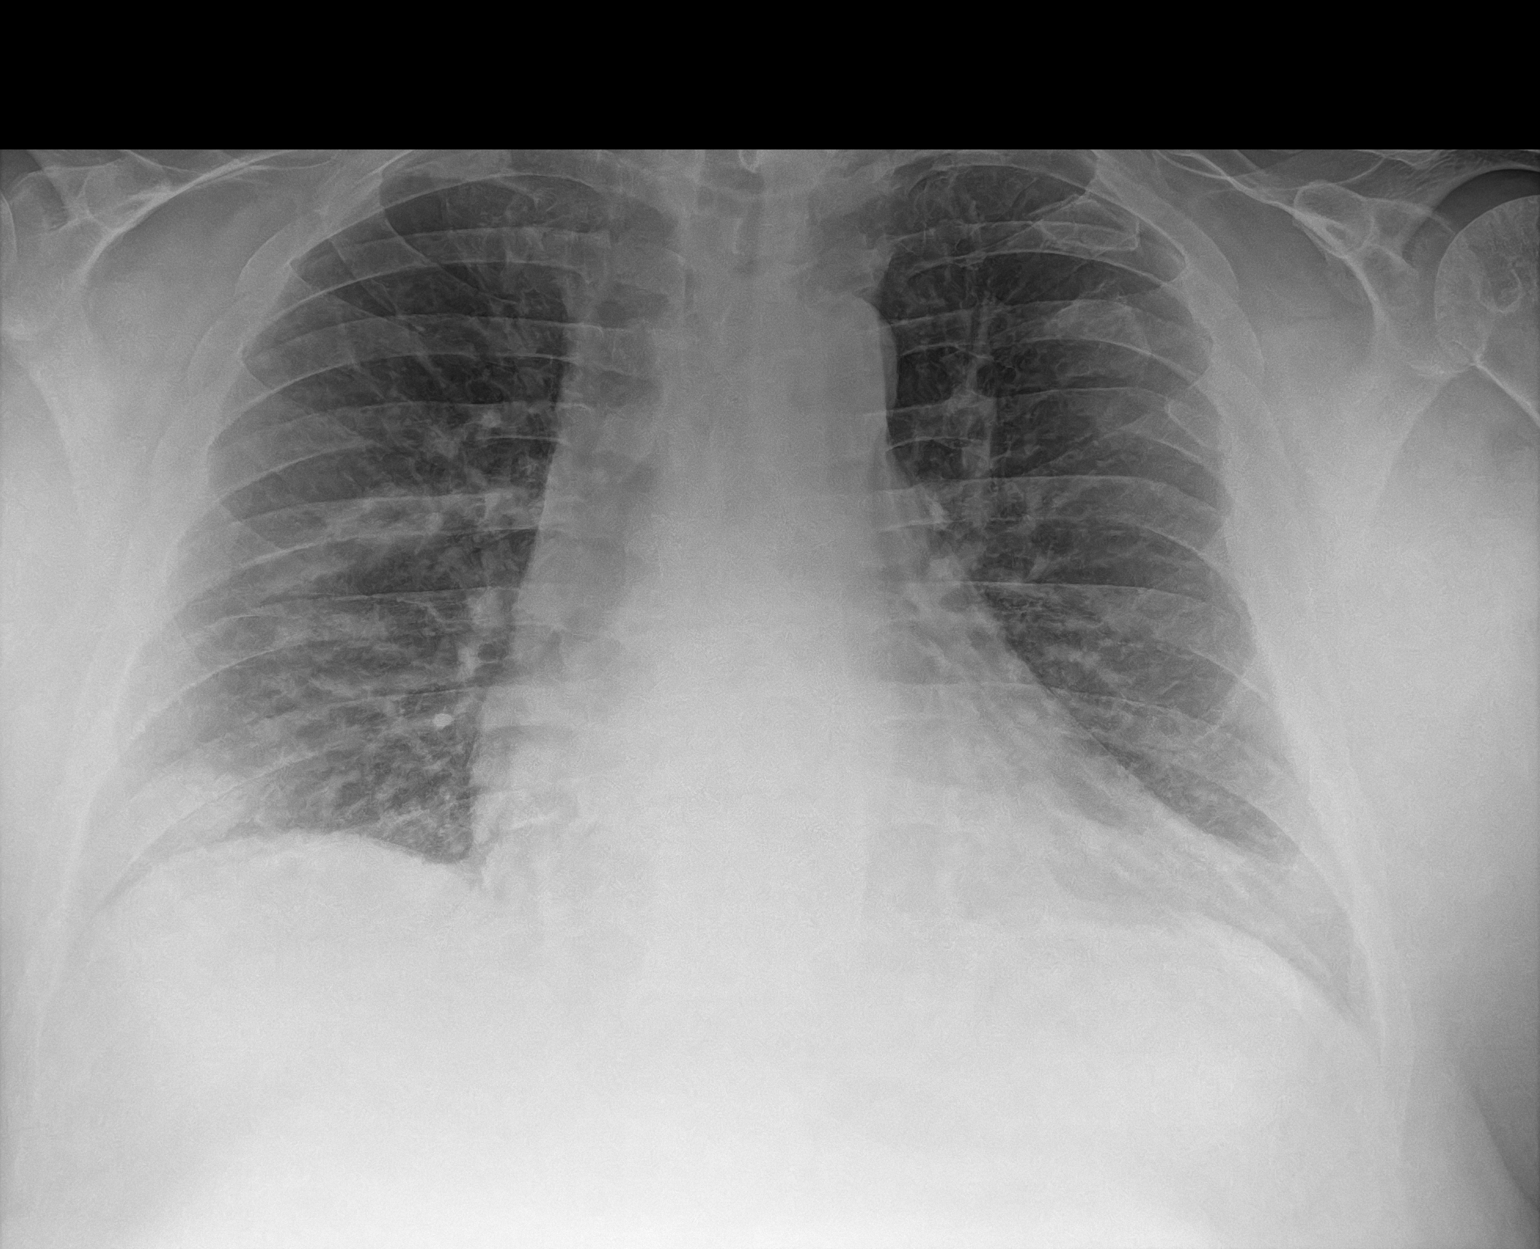

[1 of 1 positions shown; findings below may reference images not displayed]

FINDINGS: Heart size is upper limits of normal. Mildly increased interstitial
markings with patchy opacity in the peripheral aspect of the right
lung base. No pleural effusion or pneumothorax. Chronic appearing
left-sided rib deformities.
IMPRESSION: Mildly increased interstitial markings with patchy opacity in the
peripheral aspect of the right lung base, suspicious for developing
pneumonia (consider atypical/viral infection including [id number]).

## 2021-10-09 ENCOUNTER — Other Ambulatory Visit: Payer: Self-pay

## 2021-10-09 ENCOUNTER — Encounter: Payer: Self-pay | Admitting: Family Medicine

## 2021-10-09 DIAGNOSIS — Z1211 Encounter for screening for malignant neoplasm of colon: Secondary | ICD-10-CM

## 2021-10-15 ENCOUNTER — Encounter: Payer: Self-pay | Admitting: Pulmonary Disease

## 2021-10-15 ENCOUNTER — Ambulatory Visit: Payer: BC Managed Care – PPO | Admitting: Pulmonary Disease

## 2021-10-15 VITALS — BP 122/76 | HR 71 | Temp 97.8°F | Ht 72.0 in | Wt 335.0 lb

## 2021-10-15 DIAGNOSIS — G4733 Obstructive sleep apnea (adult) (pediatric): Secondary | ICD-10-CM | POA: Diagnosis not present

## 2021-10-15 NOTE — Progress Notes (Signed)
[facility]  Chief Complaint  Patient presents with   Follow-up    He is doing well with his CPAP .     Past Surgical History:  He  has a past surgical history that includes Rotator cuff repair and Chest tube insertion (Left, 2017).  Past Medical History:  HLD, Familial polyposis, HTN, BPH, PAF, Combined CHF  Constitutional:  BP 122/76 (BP Location: Left Arm, Cuff Size: Large)   Pulse 71   Temp 97.8 F (36.6 C) (Oral)   Ht 6' (1.829 m)   Wt (!) 335 lb (152 kg)   BMI 45.43 kg/m   Brief Summary:  Clayton Freeman is a 66 y.o. male with obstructive sleep apnea.      Subjective:   He is doing well with CPAP.  Has trouble with rain out.  Goes between hybrid and full face mask.    Physical Exam:   Appearance - well kempt   ENMT - no sinus tenderness, no oral exudate, no LAN, Mallampati 4 airway, no stridor  Respiratory - equal breath sounds bilaterally, no wheezing or rales  CV - s1s2 regular rate and rhythm, no murmurs  Ext - no clubbing, no edema  Skin - no rashes  Psych - normal mood and affect   Sleep Tests:  PSG 04/18/21 >> AHI 90.1 SpO2 low 71% Auto CPAP 09/12/21 to 10/11/21 >> used on 30 of 30 nights with average 6 hrs 41 min.  Average AHI 0.2 with median CPAP 10 and 95 th percentile CPAP 15 cm H2O  Cardiac Tests:  Echo 02/13/21 >> EF 45%, mild LVH, grade 2 DD, aortic root 41 mm  Social History:  He  reports that he has quit smoking. He has never used smokeless tobacco. He reports current alcohol use. He reports that he does not use drugs.  Family History:  His family history includes Colonic polyp in his father; Coronary artery disease in his paternal grandfather; Diverticulitis in his father; Hypertension in his father and mother; Prostate cancer in his father.     Assessment/Plan:   Obstructive sleep apnea. - he is compliant with CPAP and reports benefit from therapy - he uses [employer] for his DME -  current CPAP ordered in March 2023 - will change his auto CPAP to 8 - 18 cm H2O - discussed options to avoid rain out - will make sure he gets ONO with CPAP done  Paroxysmal atrial fibrillation, Chronic combined CHF - followed by Dr. Allegra Lai with EP cardiology  Obesity. - discussed how weight can impact sleep and risk for sleep disordered breathing - discussed options to assist with weight loss: combination of diet modification, cardiovascular and strength training exercises  Time Spent Involved in Patient Care on Day of Examination:  26 minutes  Follow up:   Patient Instructions  Will have your auto CPAP setting changes to 8 - 18 cm water pressure  Try insulating your CPAP tubing  Will have our patient care coordinators check on the status of your overnight oximetry that was ordered in March 2023  Follow up in 1 year  Medication List:   Allergies as of 10/15/2021       Reactions   Bee Venom Anaphylaxis, Hives, Itching, Palpitations, Rash, Shortness Of Breath, Swelling   Penicillins Anaphylaxis, Hives, Itching, Palpitations, Rash, Shortness Of Breath, Swelling   Other    Horse Radish   Erythromycin Hives, Rash, Swelling        Medication List  Accurate as of October 15, 2021  2:33 PM. If you have any questions, ask your nurse or doctor.          albuterol 108 (90 Base) MCG/ACT inhaler Commonly known as: VENTOLIN HFA Inhale 2 puffs into the lungs every 6 (six) hours as needed for wheezing or shortness of breath.   aspirin EC 81 MG tablet Take 81 mg by mouth daily.   EPINEPHrine 0.3 mg/0.3 mL Soaj injection Commonly known as: EPI-PEN Inject 0.3 mLs (0.3 mg total) into the muscle once as needed (anaphylaxis/allergic reaction).   Fish Oil 1000 MG Caps Take 1,000 mg by mouth daily at 6 (six) AM.   lisinopril 40 MG tablet Commonly known as: ZESTRIL TAKE 1 TABLET(40 MG) BY MOUTH DAILY   metoprolol succinate 50 MG 24 hr tablet Commonly known as:  TOPROL-XL Take 1 tablet (50 mg total) by mouth daily. Take with or immediately following a meal.   mexiletine 150 MG capsule Commonly known as: MEXITIL Take 2 capsules (300 mg total) by mouth 2 (two) times daily.   multivitamin tablet Take 1 tablet by mouth daily.   tamsulosin 0.4 MG Caps capsule Commonly known as: FLOMAX Take 0.4 mg by mouth daily.   VITAMIN E PO Take 1 capsule by mouth daily.        Signature:  Chesley Mires, MD [place] Pager - [phone] 10/15/2021, 2:33 PM

## 2021-10-15 NOTE — Patient Instructions (Addendum)
Will have your auto CPAP setting changes to 8 - 18 cm water pressure  Try insulating your CPAP tubing  Will have our patient care coordinators check on the status of your overnight oximetry that was ordered in March 2023  Follow up in 1 year

## 2021-10-19 DIAGNOSIS — G4733 Obstructive sleep apnea (adult) (pediatric): Secondary | ICD-10-CM | POA: Diagnosis not present

## 2021-10-22 ENCOUNTER — Telehealth: Payer: Self-pay | Admitting: Interventional Cardiology

## 2021-10-22 ENCOUNTER — Ambulatory Visit: Payer: BC Managed Care – PPO | Admitting: Cardiology

## 2021-10-22 ENCOUNTER — Encounter: Payer: Self-pay | Admitting: Cardiology

## 2021-10-22 VITALS — BP 120/76 | HR 136 | Ht 72.0 in | Wt 344.6 lb

## 2021-10-22 DIAGNOSIS — I480 Paroxysmal atrial fibrillation: Secondary | ICD-10-CM

## 2021-10-22 DIAGNOSIS — I493 Ventricular premature depolarization: Secondary | ICD-10-CM | POA: Diagnosis not present

## 2021-10-22 MED ORDER — APIXABAN 5 MG PO TABS: 5.0000 mg | ORAL_TABLET | Freq: Two times a day (BID) | ORAL | 3 refills | Status: DC

## 2021-10-22 MED ORDER — METOPROLOL SUCCINATE ER 100 MG PO TB24: 100.0000 mg | ORAL_TABLET | Freq: Every day | ORAL | 1 refills | Status: DC

## 2021-10-22 NOTE — Telephone Encounter (Signed)
Pt c/o Shortness Of Breath: STAT if SOB developed within the last 24 hours or pt is noticeably SOB on the phone  1. Are you currently SOB (can you hear that pt is SOB on the phone)? yes  2. How long have you been experiencing SOB? Since last night   3. Are you SOB when sitting or when up moving around? Up and moving around   4. Are you currently experiencing any other symptoms? Dizzy   STAT if patient feels like he/she is going to faint   Are you dizzy now? no  Do you feel faint or have you passed out? no  Do you have any other symptoms? SOB  Have you checked your HR and BP (record if available)?  Today 140/80 HR 82  Patient had a couple spread out spells of near syncope within the past 3 months. Patient said the worst spell was last Thursday
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
## 2021-10-22 NOTE — Patient Instructions (Signed)
Medication Instructions:  Your physician has recommended you make the following change in your medication:  INCREASE Toprol to 100 mg once daily START Eliquis 5 mg twice daily  3. Dr. Curt Bears recommends you have a 3 day hospital admission to start Sotalol.  The nurse will be in touch with further information on this   *If you need a refill on your cardiac medications before your next appointment, please call your pharmacy*   Lab Work: None ordered   Testing/Procedures: None ordered   Follow-Up: At [facility], you and your health needs are our priority.  As part of our continuing mission to provide you with exceptional heart care, we have created designated Provider Care Teams.  These Care Teams include your primary Cardiologist (physician) and Advanced Practice Providers (APPs -  Physician Assistants and Nurse Practitioners) who all work together to provide you with the care you need, when you need it.  Your next appointment:   To be   determined  The format for your next appointment:   In Person  Provider:   Allegra Lai, MD    Thank you for choosing [facility]!!   Trinidad Curet, RN [phone]  Other Instructions   Important Information About Sugar       Sotalol Tablets (Betapace AF) What is this medication? SOTALOL (SOE ta lole) prevents and treats a fast or irregular heartbeat (arrhythmia). It is often used to treat a type of arrhythmia known as AFib (atrial fibrillation). It works by slowing down overactive electric signals in the heart, which stabilizes your heart rhythm. It also lowers your heart rate. It belongs to a group of medications called antiarrhythmics. This medicine may be used for other purposes; ask your health care provider or pharmacist if you have questions. COMMON BRAND NAME(S): BETAPACE AF What should I tell my care team before I take this medication? They need to know if you have any of these conditions: Diabetes Heart or  vessel disease like slow heart rate, worsening heart failure, heart block, sick sinus syndrome or Raynaud's disease History of low levels of potassium or magnesium Kidney disease Liver disease Lung or breathing disease, like asthma or emphysema Pheochromocytoma Recent heart attack Thyroid disease An unusual or allergic reaction to sotalol, other beta blockers, medications, foods, dyes, or preservatives Pregnant or trying to get pregnant Breast-feeding How should I use this medication? Take this medication by mouth with a glass of water. Follow the directions on the prescription label. Take your doses at regular intervals. Do not take your medication more often than directed. Do not stop taking this medication suddenly. This could lead to serious heart-related effects. Talk to your care team regarding the use of this medication in children. Special care may be needed. While this medication may be used in children for selected conditions precautions do apply. Overdosage: If you think you have taken too much of this medicine contact a poison control center or emergency room at once. NOTE: This medicine is only for you. Do not share this medicine with others. What if I miss a dose? If you miss a dose, take it as soon as you can. If it is almost time for your next dose, take only that dose. Do not take double or extra doses. What may interact with this medication? Do not take this medication with any of the following: Amoxapine Arsenic trioxide Certain antibiotics like gatifloxacin, grepafloxacin, levofloxacin, moxifloxacin, sparfloxacin, telithromycin Cisapride Droperidol Haloperidol [place] Maprotiline Medications for malaria like chloroquine and halofantrine Medications  to control heart rhythm Methadone Pentamidine Phenothiazines like prochlorperazine, perphenazine, thioridazine Pimozide Ranolazine Tricyclic antidepressants like amitriptyline, imipramine,  nortriptyline Vardenafil This medication may also interact with the following: Antacids Certain antibiotics such as clarithromycin and erythromycin Clonidine Digoxin Medications for angina or high blood pressure Medications for colds and breathing difficulties Medications for diabetes Other beta blockers like atenolol, metoprolol, propranolol Ziprasidone This list may not describe all possible interactions. Give your health care provider a list of all the medicines, herbs, non-prescription drugs, or dietary supplements you use. Also tell them if you smoke, drink alcohol, or use illegal drugs. Some items may interact with your medicine. What should I watch for while using this medication? You will be started on this medication in a specialized facility for the first two or more days of treatment. Visit your care team for regular checks on your progress. Check your heart rate and blood pressure regularly while you are taking this medication. Ask your care team what your heart rate and blood pressure should be, and when you should contact them. Your care team also may schedule regular blood tests and electrocardiograms to check your progress. Because your condition and the use of this medication carry some risk, it is a good idea to carry an identification card, necklace or bracelet with details of your condition, medications, and care team. You may get drowsy or dizzy. Do not drive, use machinery, or do anything that needs mental alertness until you know how this medication affects you. Do not stand or sit up quickly, especially if you are an older patient. This reduces the risk of dizzy or fainting spells. Alcohol can make you more drowsy and dizzy. Avoid alcoholic drinks. Do not treat yourself for coughs, colds, or pain while you are taking this medication without asking your care team for advice. Some ingredients may increase your blood pressure. If you are going to have surgery, tell your care  team that you are taking this medication. What side effects may I notice from receiving this medication? Side effects that you should report to your care team as soon as possible: Allergic reactions--skin rash, itching, hives, swelling of the face, lips, tongue, or throat Heart failure--shortness of breath, swelling of the ankles, feet, or hands, sudden weight gain, unusual weakness or fatigue Heart rhythm changes--fast or irregular heartbeat, dizziness, feeling faint or lightheaded, chest pain, trouble breathing Low blood pressure--dizziness, feeling faint or lightheaded, blurry vision Raynaud's--cool, numb, or painful fingers or toes that may change color from pale, to blue, to red Worsening mood, feelings of depression Side effects that usually do not require medical attention (report to your care team if they continue or are bothersome): Change in sex drive or performance Diarrhea Dizziness Fatigue Headache This list may not describe all possible side effects. Call your doctor for medical advice about side effects. You may report side effects to FDA at 1-800-FDA-1088. Where should I keep my medication? Keep out of the reach of children and pets. Store at room temperature between 15 and 30 degrees C (59 and 86 degrees F). Throw away any unused medication after the expiration date. NOTE: This sheet is a summary. It may not cover all possible information. If you have questions about this medicine, talk to your doctor, pharmacist, or health care provider.  2023 Elsevier/Gold Standard (2020-06-14 00:00:00)

## 2021-10-22 NOTE — Telephone Encounter (Signed)
Spoke with pt who complains of increasing SOB over the past 24 hours.  Pt states worse with exertion.  Current BP 140/80 with O2 sat 97-98 and HR 80's 125.  Denies current CP, edema or dizziness/fainting.  Pt does have a history of PAF and is taking current medications as prescribed.  He is not anticoagulated. He states he has had symptoms like this before while in Afib. Will forward information to Dr Curt Bears and his RN for review and recommendation.  Reviewed ED precautions.  Pt verbalizes understanding and agrees with current plan.

## 2021-10-22 NOTE — Telephone Encounter (Signed)
Pt advised of appt opening today with Dr Curt Bears at [phone].  Pt agrees to appointment and thanked RN for the call back.

## 2021-10-23 ENCOUNTER — Telehealth: Payer: Self-pay | Admitting: Cardiology

## 2021-10-23 NOTE — Telephone Encounter (Signed)
Pt c/o medication issue:  1. Name of Medication: apixaban (ELIQUIS) 5 MG TABS tablet  2. How are you currently taking this medication (dosage and times per day)? 1 tablet twice a day  3. Are you having a reaction (difficulty breathing--STAT)? no  4. What is your medication issue? Patient states the prescription requires a prior authorization.

## 2021-10-23 NOTE — Telephone Encounter (Signed)
Prior auth approved through 10/23/22

## 2021-10-30 ENCOUNTER — Telehealth: Payer: Self-pay | Admitting: Pulmonary Disease

## 2021-10-30 NOTE — Telephone Encounter (Signed)
ONO with CPAP 10/25/11 >> test time 5 hrs 50 min.  Baseline SpO2 94%, low SpO2 90%.   Please let him know his oxygen test looked good.  He does not need supplemental oxygen at night.  He should continue using CPAP while asleep.

## 2021-10-30 NOTE — Telephone Encounter (Signed)
Spoke with patient  regarding ONO results. They verbalized understanding. No further questions.  Nothing further needed at this time.  

## 2021-11-02 DIAGNOSIS — G4733 Obstructive sleep apnea (adult) (pediatric): Secondary | ICD-10-CM | POA: Diagnosis not present

## 2021-11-09 ENCOUNTER — Telehealth: Payer: Self-pay | Admitting: Cardiology

## 2021-11-09 NOTE — Telephone Encounter (Signed)
I spoke with patient.  He is calling to let Sherri know admission on 7/31 is OK with him.  He will await Sherri's call back

## 2021-11-09 NOTE — Telephone Encounter (Signed)
Pt is calling stating he received a call from Trinidad Curet, RN yesterday and would like to speak to her again in regards to an appt to have him admitted into hosp on Monday 07/18. Requesting call back today.

## 2021-11-12 ENCOUNTER — Other Ambulatory Visit: Payer: Self-pay

## 2021-11-12 ENCOUNTER — Telehealth: Payer: Self-pay | Admitting: Cardiology

## 2021-11-12 ENCOUNTER — Inpatient Hospital Stay (HOSPITAL_COMMUNITY)
Admission: AD | Admit: 2021-11-12 | Discharge: 2021-11-14 | DRG: 308 | Disposition: A | Discharge status: Home / Self Care | Payer: BC Managed Care – PPO | Attending: Cardiology | Admitting: Cardiology

## 2021-11-12 DIAGNOSIS — I4510 Unspecified right bundle-branch block: Secondary | ICD-10-CM | POA: Diagnosis present

## 2021-11-12 DIAGNOSIS — R0789 Other chest pain: Secondary | ICD-10-CM | POA: Diagnosis not present

## 2021-11-12 DIAGNOSIS — I959 Hypotension, unspecified: Secondary | ICD-10-CM | POA: Diagnosis not present

## 2021-11-12 DIAGNOSIS — I429 Cardiomyopathy, unspecified: Secondary | ICD-10-CM | POA: Diagnosis not present

## 2021-11-12 DIAGNOSIS — I4891 Unspecified atrial fibrillation: Secondary | ICD-10-CM | POA: Diagnosis present

## 2021-11-12 DIAGNOSIS — E785 Hyperlipidemia, unspecified: Secondary | ICD-10-CM | POA: Diagnosis present

## 2021-11-12 DIAGNOSIS — Z6841 Body Mass Index (BMI) 40.0 and over, adult: Secondary | ICD-10-CM | POA: Diagnosis not present

## 2021-11-12 DIAGNOSIS — G4733 Obstructive sleep apnea (adult) (pediatric): Secondary | ICD-10-CM | POA: Diagnosis not present

## 2021-11-12 DIAGNOSIS — R7989 Other specified abnormal findings of blood chemistry: Secondary | ICD-10-CM | POA: Diagnosis not present

## 2021-11-12 DIAGNOSIS — Z880 Allergy status to penicillin: Secondary | ICD-10-CM | POA: Diagnosis not present

## 2021-11-12 DIAGNOSIS — I471 Supraventricular tachycardia: Secondary | ICD-10-CM | POA: Diagnosis not present

## 2021-11-12 DIAGNOSIS — Z91030 Bee allergy status: Secondary | ICD-10-CM

## 2021-11-12 DIAGNOSIS — R000 Tachycardia, unspecified: Secondary | ICD-10-CM | POA: Diagnosis not present

## 2021-11-12 DIAGNOSIS — I110 Hypertensive heart disease with heart failure: Secondary | ICD-10-CM | POA: Diagnosis not present

## 2021-11-12 DIAGNOSIS — I480 Paroxysmal atrial fibrillation: Secondary | ICD-10-CM | POA: Diagnosis not present

## 2021-11-12 DIAGNOSIS — R0602 Shortness of breath: Secondary | ICD-10-CM | POA: Diagnosis not present

## 2021-11-12 DIAGNOSIS — Z8249 Family history of ischemic heart disease and other diseases of the circulatory system: Secondary | ICD-10-CM

## 2021-11-12 DIAGNOSIS — Z7901 Long term (current) use of anticoagulants: Secondary | ICD-10-CM

## 2021-11-12 DIAGNOSIS — N179 Acute kidney failure, unspecified: Secondary | ICD-10-CM | POA: Diagnosis not present

## 2021-11-12 DIAGNOSIS — R079 Chest pain, unspecified: Secondary | ICD-10-CM | POA: Diagnosis not present

## 2021-11-12 DIAGNOSIS — K7200 Acute and subacute hepatic failure without coma: Secondary | ICD-10-CM | POA: Diagnosis not present

## 2021-11-12 DIAGNOSIS — R945 Abnormal results of liver function studies: Secondary | ICD-10-CM | POA: Diagnosis not present

## 2021-11-12 DIAGNOSIS — I428 Other cardiomyopathies: Secondary | ICD-10-CM | POA: Diagnosis not present

## 2021-11-12 DIAGNOSIS — Z881 Allergy status to other antibiotic agents status: Secondary | ICD-10-CM

## 2021-11-12 DIAGNOSIS — I4892 Unspecified atrial flutter: Secondary | ICD-10-CM | POA: Diagnosis not present

## 2021-11-12 DIAGNOSIS — I5042 Chronic combined systolic (congestive) and diastolic (congestive) heart failure: Secondary | ICD-10-CM | POA: Diagnosis present

## 2021-11-12 DIAGNOSIS — K760 Fatty (change of) liver, not elsewhere classified: Secondary | ICD-10-CM | POA: Diagnosis not present

## 2021-11-12 DIAGNOSIS — I248 Other forms of acute ischemic heart disease: Secondary | ICD-10-CM | POA: Diagnosis present

## 2021-11-12 DIAGNOSIS — Z79899 Other long term (current) drug therapy: Secondary | ICD-10-CM

## 2021-11-12 DIAGNOSIS — I10 Essential (primary) hypertension: Secondary | ICD-10-CM | POA: Diagnosis not present

## 2021-11-12 LAB — CBC
HCT: 43.9 % (ref 39.0–52.0)
Hemoglobin: 14.8 g/dL (ref 13.0–17.0)
MCH: 32.2 pg (ref 26.0–34.0)
MCHC: 33.7 g/dL (ref 30.0–36.0)
MCV: 95.4 fL (ref 80.0–100.0)
Platelets: 219 10*3/uL (ref 150–400)
RBC: 4.6 MIL/uL (ref 4.22–5.81)
RDW: 13.2 % (ref 11.5–15.5)
WBC: 11.5 10*3/uL — ABNORMAL HIGH (ref 4.0–10.5)
nRBC: 0 % (ref 0.0–0.2)

## 2021-11-12 LAB — COMPREHENSIVE METABOLIC PANEL
ALT: 31 U/L (ref 0–44)
AST: 21 U/L (ref 15–41)
Albumin: 3.8 g/dL (ref 3.5–5.0)
Alkaline Phosphatase: 61 U/L (ref 38–126)
Anion gap: 7 (ref 5–15)
BUN: 11 mg/dL (ref 8–23)
CO2: 28 mmol/L (ref 22–32)
Calcium: 9.6 mg/dL (ref 8.9–10.3)
Chloride: 104 mmol/L (ref 98–111)
Creatinine, Ser: 1.13 mg/dL (ref 0.61–1.24)
GFR, Estimated: >60 mL/min (ref >60)
Glucose, Bld: 125 mg/dL — ABNORMAL HIGH (ref 70–99)
Potassium: 3.9 mmol/L (ref 3.5–5.1)
Sodium: 139 mmol/L (ref 135–145)
Total Bilirubin: 0.7 mg/dL (ref 0.3–1.2)
Total Protein: 7 g/dL (ref 6.5–8.1)

## 2021-11-12 LAB — HIV ANTIBODY (ROUTINE TESTING W REFLEX): HIV Screen 4th Generation wRfx: NONREACTIVE

## 2021-11-12 LAB — TSH: TSH: 2.319 u[IU]/mL (ref 0.350–4.500)

## 2021-11-12 LAB — MAGNESIUM: Magnesium: 1.9 mg/dL (ref 1.7–2.4)

## 2021-11-12 MED ORDER — SODIUM CHLORIDE 0.9% FLUSH
3.0000 mL | Freq: Two times a day (BID) | INTRAVENOUS | Status: DC
  Administered 2021-11-13: 3 mL via INTRAVENOUS

## 2021-11-12 MED ORDER — POTASSIUM CHLORIDE CRYS ER 20 MEQ PO TBCR
40.0000 meq | EXTENDED_RELEASE_TABLET | ORAL | Status: AC
  Administered 2021-11-12: 40 meq via ORAL
  Filled 2021-11-12: qty 2

## 2021-11-12 MED ORDER — METOPROLOL SUCCINATE ER 100 MG PO TB24
100.0000 mg | ORAL_TABLET | Freq: Every day | ORAL | Status: DC
  Administered 2021-11-12 – 2021-11-13 (×2): 100 mg via ORAL
  Filled 2021-11-12 (×2): qty 1

## 2021-11-12 MED ORDER — SODIUM CHLORIDE 0.9 % IV SOLN
250.0000 mL | INTRAVENOUS | Status: DC | PRN
  Administered 2021-11-12: 250 mL via INTRAVENOUS

## 2021-11-12 MED ORDER — APIXABAN 5 MG PO TABS
5.0000 mg | ORAL_TABLET | Freq: Two times a day (BID) | ORAL | Status: DC
  Administered 2021-11-12 – 2021-11-14 (×4): 5 mg via ORAL
  Filled 2021-11-12 (×4): qty 1

## 2021-11-12 MED ORDER — SODIUM CHLORIDE 0.9% FLUSH: 3.0000 mL | INTRAVENOUS | Status: DC | PRN

## 2021-11-12 MED ORDER — TAMSULOSIN HCL 0.4 MG PO CAPS
0.4000 mg | ORAL_CAPSULE | Freq: Every day | ORAL | Status: DC
  Administered 2021-11-12 – 2021-11-13 (×2): 0.4 mg via ORAL
  Filled 2021-11-12 (×2): qty 1

## 2021-11-12 MED ORDER — SODIUM CHLORIDE 0.9% FLUSH: 3.0000 mL | Freq: Two times a day (BID) | INTRAVENOUS | Status: DC

## 2021-11-12 MED ORDER — SOTALOL HCL 120 MG PO TABS
120.0000 mg | ORAL_TABLET | Freq: Two times a day (BID) | ORAL | Status: DC
  Administered 2021-11-13 – 2021-11-14 (×4): 120 mg via ORAL
  Filled 2021-11-12 (×5): qty 1

## 2021-11-12 MED ORDER — MAGNESIUM SULFATE 2 GM/50ML IV SOLN
2.0000 g | Freq: Once | INTRAVENOUS | Status: AC
Start: 2021-11-12 — End: 2021-11-13
  Administered 2021-11-12: 2 g via INTRAVENOUS
  Filled 2021-11-12: qty 50

## 2021-11-12 MED ORDER — ALBUTEROL SULFATE (2.5 MG/3ML) 0.083% IN NEBU: 3.0000 mL | INHALATION_SOLUTION | Freq: Four times a day (QID) | RESPIRATORY_TRACT | Status: DC | PRN

## 2021-11-12 MED ORDER — LISINOPRIL 20 MG PO TABS
40.0000 mg | ORAL_TABLET | Freq: Every day | ORAL | Status: DC
  Administered 2021-11-13 – 2021-11-14 (×2): 40 mg via ORAL
  Filled 2021-11-12 (×3): qty 2

## 2021-11-12 NOTE — Telephone Encounter (Signed)
Follow Up:    Patient is calling Sherri  back

## 2021-11-12 NOTE — Telephone Encounter (Addendum)
Discussed Sotalol adx w/ pt. Confirms no missed doses of Eliquis. Pt did take his Mexiletine last night, but advised to stop this medication and do NOT take it this morning (reviewed w/ pharmD). Aware hospital will call him when bed is ready.  Spoke to [place] in bed placement at [facility]. Patient verbalized understanding and agreeable to plan.
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
## 2021-11-12 NOTE — Progress Notes (Signed)
   11/12/21 1836  Assess: MEWS Score  Temp 98.5 F (36.9 C)  BP (!) 153/109  MAP (mmHg) 123  Pulse Rate (!) 126  ECG Heart Rate (!) 119  Resp 18  Level of Consciousness Alert  SpO2 95 %  O2 Device Room Air  Assess: MEWS Score  MEWS Temp 0  MEWS Systolic 0  MEWS Pulse 2  MEWS RR 0  MEWS LOC 0  MEWS Score 2  MEWS Score Color Yellow  Assess: if the MEWS score is Yellow or Red  Were vital signs taken at a resting state? Yes  Focused Assessment No change from prior assessment  Does the patient meet 2 or more of the SIRS criteria? No  MEWS guidelines implemented *See Row Information* Yes  Treat  Pain Scale 0-10  Pain Score 0  Patients Stated Pain Goal 0  Take Vital Signs  Increase Vital Sign Frequency  Yellow: Q 2hr X 2 then Q 4hr X 2, if remains yellow, continue Q 4hrs  Escalate  MEWS: Escalate Yellow: discuss with charge nurse/RN and consider discussing with provider and RRT  Notify: Charge Nurse/RN  Name of Charge Nurse/RN Notified Kerrie Buffalo RN  Date Charge Nurse/RN Notified 11/12/21  Time Charge Nurse/RN Notified 1836  Document  Patient Outcome Other (Comment) (new admission to unit; stable)  Progress note created (see row info) Yes  Assess: SIRS CRITERIA  SIRS Temperature  0  SIRS Pulse 1  SIRS Respirations  0  SIRS WBC 1  SIRS Score Sum  2

## 2021-11-12 NOTE — Telephone Encounter (Signed)
Returned pt call. Aware they are still waiting for a bed at the hospital.  The hospital will call him once bed is available. Informed that I did confirm this with bed placement at [facility]. Patient verbalized understanding and agreeable to plan.

## 2021-11-12 NOTE — Progress Notes (Signed)
Pharmacy Consult for Sotalol Electrolyte Replacement  Pharmacy consulted to assist in monitoring and replacing electrolytes in this 66 y.o. male admitted on 11/12/2021 undergoing sotalol initiation . First sotalol dose: 7/31 PM  Labs:    Component Value Date/Time   K 3.9 11/12/2021 2038   MG 1.9 11/12/2021 2038    QTc 59m but in AFlutter >> corrected to 4720mper NP [place]  Plan: Potassium: K 3.8-3.9:  Hold Sotalol initiation and give KCl 40 mEq po x1 then begin Sotalol at least 2hr after KCl dose - do not need to recheck K   Magnesium: Mg 1.8-2: Give Mg 2 gm IV x1 to prevent Mg from dropping below 1.8 - do not need to recheck Mg. Appropriate to initiate Sotalol   Thank you for allowing pharmacy to participate in this patient's care   Achille Xiang D. DaMina MarblePharmD, BCPS, BCCharlotte/31/2023, 9:45 PM

## 2021-11-13 ENCOUNTER — Other Ambulatory Visit (HOSPITAL_COMMUNITY): Payer: Self-pay

## 2021-11-13 ENCOUNTER — Telehealth (HOSPITAL_COMMUNITY): Payer: Self-pay

## 2021-11-13 ENCOUNTER — Inpatient Hospital Stay (HOSPITAL_COMMUNITY): Payer: BC Managed Care – PPO

## 2021-11-13 ENCOUNTER — Telehealth: Payer: Self-pay | Admitting: Interventional Cardiology

## 2021-11-13 DIAGNOSIS — N179 Acute kidney failure, unspecified: Secondary | ICD-10-CM | POA: Diagnosis not present

## 2021-11-13 DIAGNOSIS — I4891 Unspecified atrial fibrillation: Secondary | ICD-10-CM | POA: Diagnosis not present

## 2021-11-13 DIAGNOSIS — I480 Paroxysmal atrial fibrillation: Secondary | ICD-10-CM | POA: Diagnosis not present

## 2021-11-13 DIAGNOSIS — I248 Other forms of acute ischemic heart disease: Secondary | ICD-10-CM | POA: Diagnosis not present

## 2021-11-13 DIAGNOSIS — K7200 Acute and subacute hepatic failure without coma: Secondary | ICD-10-CM | POA: Diagnosis not present

## 2021-11-13 LAB — BASIC METABOLIC PANEL
Anion gap: 13 (ref 5–15)
BUN: 11 mg/dL (ref 8–23)
CO2: 23 mmol/L (ref 22–32)
Calcium: 9.1 mg/dL (ref 8.9–10.3)
Chloride: 103 mmol/L (ref 98–111)
Creatinine, Ser: 0.9 mg/dL (ref 0.61–1.24)
GFR, Estimated: >60 mL/min (ref >60)
Glucose, Bld: 107 mg/dL — ABNORMAL HIGH (ref 70–99)
Potassium: 4.1 mmol/L (ref 3.5–5.1)
Sodium: 139 mmol/L (ref 135–145)

## 2021-11-13 LAB — MAGNESIUM: Magnesium: 1.9 mg/dL (ref 1.7–2.4)

## 2021-11-13 MED ORDER — POLYETHYLENE GLYCOL 3350 17 G PO PACK
17.0000 g | PACK | Freq: Every day | ORAL | Status: DC
  Administered 2021-11-13 – 2021-11-14 (×2): 17 g via ORAL
  Filled 2021-11-13 (×2): qty 1

## 2021-11-13 MED ORDER — MAGNESIUM SULFATE 2 GM/50ML IV SOLN
2.0000 g | Freq: Once | INTRAVENOUS | Status: AC
  Administered 2021-11-13: 2 g via INTRAVENOUS
  Filled 2021-11-13: qty 50

## 2021-11-13 NOTE — Telephone Encounter (Signed)
    Primary Cardiologist: [name], MD  Chart reviewed as part of pre-operative protocol coverage. Simple dental extractions are considered low risk procedures per guidelines and generally do not require any specific cardiac clearance. It is also generally accepted that for simple extractions and dental cleanings, there is no need to interrupt blood thinner therapy.   SBE prophylaxis is not required for the patient.  I will route this recommendation to the requesting party via Epic fax function and remove from pre-op pool.  Please call with questions.  Deberah Pelton, NP 11/13/2021, 3:07 PM

## 2021-11-13 NOTE — Progress Notes (Signed)
Pt places sefl on/off his home cpap unit when ready.  RT will cont to monitor.

## 2021-11-13 NOTE — Progress Notes (Addendum)
Progress Note  Patient Name: Clayton Freeman Date of Encounter: 11/13/2021  [facility] Cardiologist: [name], MD   Subjective   Thinks he can tell the difference, feels better  Inpatient Medications    Scheduled Meds:  apixaban  5 mg Oral BID   lisinopril  40 mg Oral Daily   metoprolol succinate  100 mg Oral QHS   sodium chloride flush  3 mL Intravenous Q12H   sodium chloride flush  3 mL Intravenous Q12H   sotalol  120 mg Oral Q12H   tamsulosin  0.4 mg Oral Daily   Continuous Infusions:  sodium chloride Stopped (11/13/21 0006)   PRN Meds: sodium chloride, albuterol, sodium chloride flush   Vital Signs    Vitals:   11/12/21 2115 11/13/21 0015 11/13/21 0426 11/13/21 0817  BP:  121/88 123/81 114/78  Pulse: 89 93 (!) 122 73  Resp: 18   18  Temp:  98.3 F (36.8 C) 99 F (37.2 C) 98.3 F (36.8 C)  TempSrc:  Oral Oral Oral  SpO2: 95% 97% 95%   Weight:      Height:        Intake/Output Summary (Last 24 hours) at 11/13/2021 0825 Last data filed at 11/13/2021 0006 Gross per 24 hour  Intake 247.65 ml  Output --  Net 247.65 ml      11/12/2021    6:36 PM 10/22/2021    3:39 PM 10/15/2021    2:07 PM  Last 3 Weights  Weight (lbs) 339 lb 6.4 oz 344 lb 9.6 oz 335 lb  Weight (kg) 153.951 kg 156.31 kg 151.955 kg      Telemetry    SR 70's - Personally Reviewed  ECG    AFlutter 118bpm, RBBB, QT is difficult including flutter waves, stable from prior - Personally Reviewed with Dr. Curt Bears  Physical Exam   GEN: No acute distress.   Neck: No JVD Cardiac: RRR, no murmurs, rubs, or gallops.  Respiratory: CTA b/l. GI: Soft, nontender, non-distended  MS: No edema; No deformity. Neuro:  Nonfocal  Psych: Normal affect   Labs    High Sensitivity Troponin:  No results for input(s): "TROPONINIHS" in the last 720 hours.   Chemistry Recent Labs  Lab 11/12/21 2038 11/13/21 0430  NA 139 139  K 3.9 4.1  CL 104 103  CO2 28 23  GLUCOSE 125* 107*  BUN 11 11   CREATININE 1.13 0.90  CALCIUM 9.6 9.1  MG 1.9 1.9  PROT 7.0  --   ALBUMIN 3.8  --   AST 21  --   ALT 31  --   ALKPHOS 61  --   BILITOT 0.7  --   GFRNONAA >60 >60  ANIONGAP 7 13    Lipids No results for input(s): "CHOL", "TRIG", "HDL", "LABVLDL", "LDLCALC", "CHOLHDL" in the last 168 hours.  Hematology Recent Labs  Lab 11/12/21 2038  WBC 11.5*  RBC 4.60  HGB 14.8  HCT 43.9  MCV 95.4  MCH 32.2  MCHC 33.7  RDW 13.2  PLT 219   Thyroid  Recent Labs  Lab 11/12/21 2038  TSH 2.319    BNPNo results for input(s): "BNP", "PROBNP" in the last 168 hours.  DDimer No results for input(s): "DDIMER" in the last 168 hours.   Radiology    No results found.  Cardiac Studies   02/13/2021: TTE  1. Left ventricular ejection fraction, by estimation, is 45%. The left  ventricle has mildly decreased function. The left ventricle  demonstrates  global hypokinesis. There is mild left ventricular hypertrophy. Left  ventricular diastolic parameters are  consistent with Grade II diastolic dysfunction (pseudonormalization).   2. Right ventricular systolic function is normal. The right ventricular  size is mildly enlarged. Tricuspid regurgitation signal is inadequate for  assessing PA pressure.   3. Left atrial size was mildly dilated.   4. Right atrial size was mildly dilated.   5. The mitral valve is normal in structure. Trivial mitral valve  regurgitation. No evidence of mitral stenosis.   6. The aortic valve is tricuspid. Aortic valve regurgitation is not  visualized. Mild aortic valve sclerosis is present, with no evidence of  aortic valve stenosis.   7. Aortic dilatation noted. There is mild dilatation of the aortic root,  measuring 41 mm.   8. The inferior vena cava is normal in size with greater than 50%  respiratory variability, suggesting right atrial pressure of 3 mmHg.   9. Technically difficult study with poor acoustic windows. EF  determination was difficult with both poor  images and ventricular bigeminy  present, suspect at least mild hypokinesis.   Patient Profile     66 y.o. male HTN, morbid obesity, OSA, chronic CHF (combined), AFib, PVCs,  RBBB admitted for sotalol load  Assessment & Plan    Paroxysmal Afib CHA2DS2Vasc is 2, on 5 mg po Eilquis BID 2.   Frequent PVCs Sotalol load is in progress QT in SR this AM looks good 481m K+ 4.1 Mag 1.9 Creat 0.90  Converted with drub, minimal PVC burden noted    Chronic CHF (combined)       Systolic dysfunction suspect 2/2 PVCs        Does not appear volume OL currently       Home meds     4. HTN  Home meds D/w Dr. CCurt Bearsfor now, continue metoprolol unchanged     5. OSA CPAP at bedside  For questions or updates, please contact [place]Please consult [email or web address] for contact info under        Signed, RBaldwin Jamaica PA-C  11/13/2021, 8:25 AM    I have seen and examined this patient with RTommye Standard  Agree with above, note added to reflect my findings.  Patient feeling well without complaint.  He has converted to sinus rhythm.  His ventricular arrhythmia burden appears significantly reduced.  GEN: Well nourished, well developed, in no acute distress  HEENT: normal  Neck: no JVD, carotid bruits, or masses Cardiac: RRR; no murmurs, rubs, or gallops,no edema  Respiratory:  clear to auscultation bilaterally, normal work of breathing GI: soft, nontender, nondistended, + BS MS: no deformity or atrophy  Skin: warm and dry,  neuro:  Strength and sensation are intact Psych: euthymic mood, full affect   Persistent atrial fibrillation: CHA2DS2-VASc of 2.  Currently on Eliquis 5 mg twice daily.  He is fortunately converted to sinus rhythm.  QTc is acceptable.  We Corine Solorio continue with current management. PVCs: Was having a significantly elevated burden.  PVC burden is significantly decreased on current dose of sotalol.  We Trayton Szabo continue with current management as above. Chronic systolic  heart failure: Potentially due to an elevated PVC burden.  Continue sotalol. Hypertension: Continue home medications Obstructive sleep apnea: CPAP compliance encouraged  Breya Cass M. Alyviah Crandle MD 11/13/2021 3:37 PM

## 2021-11-13 NOTE — Telephone Encounter (Signed)
   Pre-operative Risk Assessment    Patient Name: Clayton Freeman  DOB: 1955/08/17 MRN: [id number]     Request for Surgical Clearance    Procedure:  Dental Extraction - Amount of Teeth to be Pulled:  1 - bone graft   Date of Surgery:  Clearance TBD                                 Surgeon:  Dr. Consuella Lose  Surgeon's Group or Practice Name:  [facility] Phone number:  [phone] Fax number:  [phone]   Type of Clearance Requested:   - Medical  - Pharmacy:  Hold not sure       Type of Anesthesia:  Local    Additional requests/questions:   Roselyn Reef called stating that pt was supposed to have extraction yesterday but he is in the hospital because he's been starting on a new blood thinner.   Dorthey Sawyer   11/13/2021, 2:42 PM

## 2021-11-13 NOTE — Progress Notes (Signed)
Pharmacy Consult for Sotalol Electrolyte Replacement- Follow Up  Pharmacy consulted to assist in monitoring and replacing electrolytes in this 66 y.o. male admitted on 11/12/2021 undergoing sotalol initiation . First sotalol dose: 8/1 0000.  Labs:    Component Value Date/Time   K 4.1 11/13/2021 0430   MG 1.9 11/13/2021 0430     Plan: Potassium: K >/= 4: No additional supplementation needed  Magnesium: Mg 1.8-2: Give Mg 2 gm IV x1 to prevent Mg from dropping below 1.8.  Patient did not require any potassium supplementation today, will continue to follow.  Thank you for allowing pharmacy to participate in this patient's care   [facility], Pharm.D PGY1 Pharmacy Resident 11/13/2021 10:50 AM

## 2021-11-13 NOTE — Telephone Encounter (Signed)
Patient Research scientist (life sciences) completed.     The patient is currently admitted and upon discharge could be taking Sotalol.   The current 30 day co-pay is, $10.   The patient is insured through [employer].

## 2021-11-13 NOTE — Progress Notes (Signed)
Post dose EKG is reviewed SR 73bpm Manually measured QT 471m, QRS 1447mQTc allowing foir QRS is good, 46319mContinue sotalol  RenTommye StandardA-C

## 2021-11-13 NOTE — Care Management (Signed)
  Transition of Care (TOC) Screening Note   Patient Details  Name: Clayton Freeman Date of Birth: [birth year]   Transition of Care [facility]) CM/SW Contact:    Bethena Roys, RN Phone Number: 11/13/2021, 12:36 PM    Transition of Care Department [facility]) has reviewed patient and no TOC needs have been identified at this time. Patient presented for Sotalol Load. Benefits check has been completed per pharmacy tech. Case Manager will continue to follow for additional needs as the patient progresses.

## 2021-11-13 NOTE — TOC Benefit Eligibility Note (Signed)
Patient Research scientist (life sciences) completed.     The patient is currently admitted and upon discharge could be taking Sotalol.   The current 30 day co-pay is, $10.   The patient is insured through [employer].

## 2021-11-14 ENCOUNTER — Inpatient Hospital Stay (HOSPITAL_COMMUNITY)
Admission: EM | Admit: 2021-11-14 | Discharge: 2021-11-18 | Disposition: A | Discharge status: Home / Self Care | Payer: BC Managed Care – PPO | Source: Home / Self Care | Attending: Cardiology | Admitting: Cardiology

## 2021-11-14 ENCOUNTER — Other Ambulatory Visit (HOSPITAL_COMMUNITY): Payer: Self-pay

## 2021-11-14 DIAGNOSIS — Z87891 Personal history of nicotine dependence: Secondary | ICD-10-CM

## 2021-11-14 DIAGNOSIS — I4892 Unspecified atrial flutter: Secondary | ICD-10-CM | POA: Diagnosis present

## 2021-11-14 DIAGNOSIS — I428 Other cardiomyopathies: Secondary | ICD-10-CM | POA: Diagnosis present

## 2021-11-14 DIAGNOSIS — Z7901 Long term (current) use of anticoagulants: Secondary | ICD-10-CM

## 2021-11-14 DIAGNOSIS — I4891 Unspecified atrial fibrillation: Secondary | ICD-10-CM | POA: Diagnosis not present

## 2021-11-14 DIAGNOSIS — Z91018 Allergy to other foods: Secondary | ICD-10-CM

## 2021-11-14 DIAGNOSIS — Z8249 Family history of ischemic heart disease and other diseases of the circulatory system: Secondary | ICD-10-CM

## 2021-11-14 DIAGNOSIS — Z79899 Other long term (current) drug therapy: Secondary | ICD-10-CM

## 2021-11-14 DIAGNOSIS — I959 Hypotension, unspecified: Secondary | ICD-10-CM | POA: Diagnosis present

## 2021-11-14 DIAGNOSIS — Z880 Allergy status to penicillin: Secondary | ICD-10-CM

## 2021-11-14 DIAGNOSIS — E785 Hyperlipidemia, unspecified: Secondary | ICD-10-CM | POA: Diagnosis present

## 2021-11-14 DIAGNOSIS — I4510 Unspecified right bundle-branch block: Secondary | ICD-10-CM | POA: Diagnosis present

## 2021-11-14 DIAGNOSIS — Z6841 Body Mass Index (BMI) 40.0 and over, adult: Secondary | ICD-10-CM

## 2021-11-14 DIAGNOSIS — I480 Paroxysmal atrial fibrillation: Secondary | ICD-10-CM | POA: Diagnosis present

## 2021-11-14 DIAGNOSIS — I110 Hypertensive heart disease with heart failure: Secondary | ICD-10-CM | POA: Diagnosis present

## 2021-11-14 DIAGNOSIS — K7200 Acute and subacute hepatic failure without coma: Secondary | ICD-10-CM | POA: Diagnosis present

## 2021-11-14 DIAGNOSIS — I248 Other forms of acute ischemic heart disease: Secondary | ICD-10-CM | POA: Diagnosis not present

## 2021-11-14 DIAGNOSIS — G4733 Obstructive sleep apnea (adult) (pediatric): Secondary | ICD-10-CM | POA: Diagnosis present

## 2021-11-14 DIAGNOSIS — R000 Tachycardia, unspecified: Principal | ICD-10-CM

## 2021-11-14 DIAGNOSIS — I471 Supraventricular tachycardia: Principal | ICD-10-CM | POA: Diagnosis present

## 2021-11-14 DIAGNOSIS — Z881 Allergy status to other antibiotic agents status: Secondary | ICD-10-CM

## 2021-11-14 DIAGNOSIS — N179 Acute kidney failure, unspecified: Secondary | ICD-10-CM | POA: Diagnosis present

## 2021-11-14 DIAGNOSIS — Z886 Allergy status to analgesic agent status: Secondary | ICD-10-CM

## 2021-11-14 DIAGNOSIS — I5042 Chronic combined systolic (congestive) and diastolic (congestive) heart failure: Secondary | ICD-10-CM | POA: Diagnosis present

## 2021-11-14 DIAGNOSIS — Z91030 Bee allergy status: Secondary | ICD-10-CM

## 2021-11-14 LAB — BASIC METABOLIC PANEL
Anion gap: 9 (ref 5–15)
BUN: 11 mg/dL (ref 8–23)
CO2: 24 mmol/L (ref 22–32)
Calcium: 8.9 mg/dL (ref 8.9–10.3)
Chloride: 99 mmol/L (ref 98–111)
Creatinine, Ser: 0.93 mg/dL (ref 0.61–1.24)
GFR, Estimated: >60 mL/min (ref >60)
Glucose, Bld: 167 mg/dL — ABNORMAL HIGH (ref 70–99)
Potassium: 4.2 mmol/L (ref 3.5–5.1)
Sodium: 132 mmol/L — ABNORMAL LOW (ref 135–145)

## 2021-11-14 LAB — MAGNESIUM: Magnesium: 2 mg/dL (ref 1.7–2.4)

## 2021-11-14 MED ORDER — ETOMIDATE 2 MG/ML IV SOLN
10.0000 mg | Freq: Once | INTRAVENOUS | Status: DC
  Filled 2021-11-14: qty 10

## 2021-11-14 MED ORDER — ADENOSINE 6 MG/2ML IV SOLN
INTRAVENOUS | Status: AC
  Filled 2021-11-14: qty 6

## 2021-11-14 MED ORDER — SOTALOL HCL 120 MG PO TABS
120.0000 mg | ORAL_TABLET | Freq: Two times a day (BID) | ORAL | 5 refills | Status: DC
  Filled 2021-11-14: qty 60, 30d supply, fill #0

## 2021-11-14 MED ORDER — ACETAMINOPHEN 325 MG PO TABS
650.0000 mg | ORAL_TABLET | Freq: Four times a day (QID) | ORAL | Status: DC | PRN
  Filled 2021-11-14: qty 2

## 2021-11-14 NOTE — ED Triage Notes (Signed)
Pt arrived via [facility] for rapid heart rate.Initial heart rate for EMS was 224, 6 and then 12 of adenosine were administered via 18g LAC with rate decrease to 100bpm, then increased back to 224 bp. '150mg'$  amiodarone then administered. Pt is alert and oriented, blood pressure and oxygen saturation stable enroute. Pt notably diaphoretic. PMH of similar dysthymia.

## 2021-11-14 NOTE — Discharge Summary (Cosign Needed Addendum)
ELECTROPHYSIOLOGY PROCEDURE DISCHARGE SUMMARY    Patient ID: Clayton Freeman,  MRN: [id number], DOB/AGE: [birth year] 66 y.o.  Admit date: 11/12/2021 Discharge date: 11/14/2021  Primary Care Physician: Vivi Barrack, MD Primary Cardiologist: Dr. Tamala Julian Electrophysiologist: Dr. Curt Bears  Primary Discharge Diagnosis:  1.  paroxysmal atrial fibrillation status post  SOTALOL loading this admission      CHA2DS2Vasc is 2, on Eliquis 2.   PVCs  Secondary Discharge Diagnosis:  HTN Chronic CHF Diastolic Compensated currently OSA Compliant with his CPAP RBBB  Allergies  Allergen Reactions   Bee Venom Anaphylaxis, Hives, Itching, Palpitations, Rash, Shortness Of Breath and Swelling   Penicillins Anaphylaxis, Hives, Itching, Palpitations, Rash, Shortness Of Breath and Swelling   Other     Horse Radish   Erythromycin Hives, Rash and Swelling     Procedures This Admission:  1.  Sotalol loading   Brief HPI: Clayton Freeman is a 66 y.o. male with a past medical history as noted above.  They were referred to EP in the outpatient setting for treatment options of atrial fibrillation.  Risks, benefits, and alternatives to Tikosyn were reviewed with the patient who wished to proceed.    Hospital Course:  The patient was admitted and  SOTALOL was initiated.  Renal function and electrolytes were followed during the hospitalization.  The patient's QTc remained stable.  He converted with drug and did not require DCCV.  He was monitored until discharge on telemetry which demonstrated SR, PACs that were fairlly frequent and minimal PVC burden.  CXR was done with come reports overnight of SOB that was with no acute findings and symptom spontaneously resolved.  He had an isolated temp of 100 also spontaneously resolved with no symptoms of illness of fever.  On the day of discharge, he feels well, was examined by Dr Curt Bears who considered the patient stable for discharge to home.  Follow-up has been  arranged with the AFib clinic in 1 week and with EP service in 4 weeks.   No new or additional electrolyte replacement for home We will continue his metoprolol unchanged for now No mexiletine  Physical Exam: Vitals:   11/13/21 2033 11/14/21 0457 11/14/21 0608 11/14/21 0818  BP: 114/65 (!) 102/49    Pulse:  75  (!) 0  Resp:  18    Temp: 98.9 F (37.2 C) (!) 100.4 F (38 C) 98.3 F (36.8 C)   TempSrc: Oral Oral Oral   SpO2: 98% 99%    Weight:      Height:         Labs:   Lab Results  Component Value Date   WBC 11.5 (H) 11/12/2021   HGB 14.8 11/12/2021   HCT 43.9 11/12/2021   MCV 95.4 11/12/2021   PLT 219 11/12/2021    Recent Labs  Lab 11/12/21 2038 11/13/21 0430 11/14/21 0852  NA 139   < > 132*  K 3.9   < > 4.2  CL 104   < > 99  CO2 28   < > 24  BUN 11   < > 11  CREATININE 1.13   < > 0.93  CALCIUM 9.6   < > 8.9  PROT 7.0  --   --   BILITOT 0.7  --   --   ALKPHOS 61  --   --   ALT 31  --   --   AST 21  --   --   GLUCOSE 125*   < > 167*   < > =  values in this interval not displayed.     Discharge Medications:  Allergies as of 11/14/2021       Reactions   Bee Venom Anaphylaxis, Hives, Itching, Palpitations, Rash, Shortness Of Breath, Swelling   Penicillins Anaphylaxis, Hives, Itching, Palpitations, Rash, Shortness Of Breath, Swelling   Other    Horse Radish   Erythromycin Hives, Rash, Swelling        Medication List     TAKE these medications    albuterol 108 (90 Base) MCG/ACT inhaler Commonly known as: VENTOLIN HFA Inhale 2 puffs into the lungs every 6 (six) hours as needed for wheezing or shortness of breath.   apixaban 5 MG Tabs tablet Commonly known as: ELIQUIS Take 1 tablet (5 mg total) by mouth 2 (two) times daily.   EPINEPHrine 0.3 mg/0.3 mL Soaj injection Commonly known as: EPI-PEN Inject 0.3 mLs (0.3 mg total) into the muscle once as needed (anaphylaxis/allergic reaction).   Fish Oil 1000 MG Caps Take 1,000 mg by mouth daily at 6  (six) AM.   lisinopril 40 MG tablet Commonly known as: ZESTRIL TAKE 1 TABLET(40 MG) BY MOUTH DAILY What changed: See the new instructions.   metoprolol succinate 100 MG 24 hr tablet Commonly known as: TOPROL-XL Take 1 tablet (100 mg total) by mouth daily. Take with or immediately following a meal. What changed: Another medication with the same name was removed. Continue taking this medication, and follow the directions you see here.   multivitamin tablet Take 1 tablet by mouth daily.   PATADAY OP Place 1 drop into both eyes daily.   sotalol 120 MG tablet Commonly known as: BETAPACE Take 1 tablet (120 mg total) by mouth every 12 (twelve) hours.   tamsulosin 0.4 MG Caps capsule Commonly known as: FLOMAX Take 0.4 mg by mouth every evening.   VITAMIN E PO Take 1 capsule by mouth daily.        Disposition: Home Discharge Instructions     Diet - low sodium heart healthy   Complete by: As directed    Increase activity slowly   Complete by: As directed         Duration of Discharge Encounter: Greater than 30 minutes including physician time.  Venetia Night, PA-C 11/14/2021 1:15 PM  I have seen and examined this patient with Tommye Standard.  Agree with above, note added to reflect my findings.  Patinet admitedd for sotalol load. Converted to sinus rhythm with reduced PVCs. Will plan for discharge with clinc follow up.  GEN: Well nourished, well developed, in no acute distress  HEENT: normal  Neck: no JVD, carotid bruits, or masses Cardiac: RRR; no murmurs, rubs, or gallops,no edema  Respiratory:  clear to auscultation bilaterally, normal work of breathing GI: soft, nontender, nondistended, + BS MS: no deformity or atrophy  Skin: warm and dry,  Neuro:  Strength and sensation are intact Psych: euthymic mood, full affect     Will M. Camnitz MD 11/15/2021 7:03 AM   ADDENDUM 12/16/21 Correction to antiarrhythmic drug name made only as above Tommye Standard, PA-C

## 2021-11-14 NOTE — Progress Notes (Addendum)
Progress Note  Patient Name: Clayton Freeman Date of Encounter: 11/14/2021  [facility] Cardiologist: Clayton Grooms, MD   Subjective   Had some SOB last night, ? Temp 100 > 98, feels well this AM  Inpatient Medications    Scheduled Meds:  apixaban  5 mg Oral BID   lisinopril  40 mg Oral Daily   metoprolol succinate  100 mg Oral QHS   polyethylene glycol  17 g Oral Daily   sodium chloride flush  3 mL Intravenous Q12H   sodium chloride flush  3 mL Intravenous Q12H   sotalol  120 mg Oral Q12H   tamsulosin  0.4 mg Oral Daily   Continuous Infusions:  sodium chloride Stopped (11/13/21 0006)   PRN Meds: sodium chloride, acetaminophen, albuterol, sodium chloride flush   Vital Signs    Vitals:   11/13/21 1954 11/13/21 2033 11/14/21 0457 11/14/21 0608  BP:  114/65 (!) 102/49   Pulse: 72  75   Resp: 16  18   Temp:  98.9 F (37.2 C) (!) 100.4 F (38 C) 98.3 F (36.8 C)  TempSrc:  Oral Oral Oral  SpO2: 99% 98% 99%   Weight:      Height:        Intake/Output Summary (Last 24 hours) at 11/14/2021 0718 Last data filed at 11/13/2021 2130 Gross per 24 hour  Intake 290 ml  Output --  Net 290 ml      11/12/2021    6:36 PM 10/22/2021    3:39 PM 10/15/2021    2:07 PM  Last 3 Weights  Weight (lbs) 339 lb 6.4 oz 344 lb 9.6 oz 335 lb  Weight (kg) 153.951 kg 156.31 kg 151.955 kg      Telemetry    SR 70's-80s, infrequent PACs and PVCs - Personally Reviewed  ECG    SR 75bpm, RBBB, LAD, manually measured QT 475m,  - Personally Reviewed with Dr. CCurt Freeman Physical Exam   Unchanged GEN: No acute distress.   Neck: No JVD Cardiac: RRR, no murmurs, rubs, or gallops.  Respiratory: CTA b/l. GI: Soft, nontender, non-distended, obese MS: No edema; No deformity. Neuro:  Nonfocal  Psych: Normal affect   Labs    High Sensitivity Troponin:  No results for input(s): "TROPONINIHS" in the last 720 hours.   Chemistry Recent Labs  Lab 11/12/21 2038 11/13/21 0430  NA 139  139  K 3.9 4.1  CL 104 103  CO2 28 23  GLUCOSE 125* 107*  BUN 11 11  CREATININE 1.13 0.90  CALCIUM 9.6 9.1  MG 1.9 1.9  PROT 7.0  --   ALBUMIN 3.8  --   AST 21  --   ALT 31  --   ALKPHOS 61  --   BILITOT 0.7  --   GFRNONAA >60 >60  ANIONGAP 7 13    Lipids No results for input(s): "CHOL", "TRIG", "HDL", "LABVLDL", "LDLCALC", "CHOLHDL" in the last 168 hours.  Hematology Recent Labs  Lab 11/12/21 2038  WBC 11.5*  RBC 4.60  HGB 14.8  HCT 43.9  MCV 95.4  MCH 32.2  MCHC 33.7  RDW 13.2  PLT 219   Thyroid  Recent Labs  Lab 11/12/21 2038  TSH 2.319    BNPNo results for input(s): "BNP", "PROBNP" in the last 168 hours.  DDimer No results for input(s): "DDIMER" in the last 168 hours.   Radiology    DG Chest 2 View  Result Date: 11/13/2021 CLINICAL DATA:  Shortness  of breath EXAM: CHEST - 2 VIEW COMPARISON:  11/10/2020 FINDINGS: Cardiac shadow is enlarged. Aortic calcifications are noted. The lungs are well aerated bilaterally. No focal infiltrate or effusion is seen. Mild degenerative changes of the thoracic spine are noted. Multiple old rib fractures are noted on the left. IMPRESSION: No active cardiopulmonary disease. Electronically Signed   By: Inez Catalina M.D.   On: 11/13/2021 19:14    Cardiac Studies   02/13/2021: TTE  1. Left ventricular ejection fraction, by estimation, is 45%. The left  ventricle has mildly decreased function. The left ventricle demonstrates  global hypokinesis. There is mild left ventricular hypertrophy. Left  ventricular diastolic parameters are  consistent with Grade II diastolic dysfunction (pseudonormalization).   2. Right ventricular systolic function is normal. The right ventricular  size is mildly enlarged. Tricuspid regurgitation signal is inadequate for  assessing PA pressure.   3. Left atrial size was mildly dilated.   4. Right atrial size was mildly dilated.   5. The mitral valve is normal in structure. Trivial mitral valve   regurgitation. No evidence of mitral stenosis.   6. The aortic valve is tricuspid. Aortic valve regurgitation is not  visualized. Mild aortic valve sclerosis is present, with no evidence of  aortic valve stenosis.   7. Aortic dilatation noted. There is mild dilatation of the aortic root,  measuring 41 mm.   8. The inferior vena cava is normal in size with greater than 50%  respiratory variability, suggesting right atrial pressure of 3 mmHg.   9. Technically difficult study with poor acoustic windows. EF  determination was difficult with both poor images and ventricular bigeminy  present, suspect at least mild hypokinesis.   Patient Profile     66 y.o. male HTN, morbid obesity, OSA, chronic CHF (combined), AFib, PVCs,  RBBB admitted for sotalol load  Assessment & Plan    Paroxysmal Afib CHA2DS2Vasc is 2, on 5 mg po Eilquis BID 2.   Frequent PVCs Sotalol load is in progress QT stable Labs are pending  Potentially home this AM if EKG/QT stable    Chronic CHF (combined)       Systolic dysfunction suspect 2/2 PVCs        Does not appear volume OL currently       Home meds  C/o some SOB last night, CXR was done and clear Exam is neg for volume OL     4. HTN  Home meds D/w Dr. Curt Freeman for now, continue metoprolol unchanged HR/intervals remain stable     5. OSA CPAP at bedside Reports compliance  Dr. Curt Freeman has seen the pt  For questions or updates, please contact [place] Please consult [email or web address] for contact info under        Signed, Baldwin Jamaica, PA-C  11/14/2021, 7:18 AM    I have seen and examined this patient with Tommye Standard.  Agree with above, note added to reflect my findings.  Continues to feel well.  Got short of breath last night with chest x-ray showing no major abnormality.  Feels well this morning.  Remains in sinus rhythm with minimal PVCs.  GEN: Well nourished, well developed, in no acute distress  HEENT: normal  Neck: no JVD,  carotid bruits, or masses Cardiac: RRR; no murmurs, rubs, or gallops,no edema  Respiratory:  clear to auscultation bilaterally, normal work of breathing GI: soft, nontender, nondistended, + BS MS: no deformity or atrophy  Skin: warm and dry Neuro:  Strength and sensation are  intact Psych: euthymic mood, full affect   Persistent atrial fibrillation: Currently on Eliquis 5 mg twice daily.  Loading with sotalol.  He is converted to sinus rhythm.  We Catrena Vari continue with current management.  QTc is remained stable. PVCs: Had a previously elevated burden.  PVC burden is significantly reduced.  Having more PACs and PVCs currently.  We Adynn Caseres continue with current management with sotalol.  Should QTc remained stable on his morning dose of sotalol, potential plan for discharge with follow-up in A-fib clinic.  Kaeleigh Westendorf M. Roniesha Hollingshead MD 11/14/2021 7:59 AM

## 2021-11-14 NOTE — ED Notes (Signed)
Pt converted to normal sinus rhythm at this time. Holding on cardioversion and sedation procedure at this time. Hypotension improving.

## 2021-11-14 NOTE — Progress Notes (Signed)
Mobility Specialist Progress Note:   11/14/21 1003  Mobility  Activity Ambulated with assistance in hallway  Level of Assistance Independent  Assistive Device None  Distance Ambulated (ft) 1100 ft  Activity Response Tolerated well  $Mobility charge 1 Mobility   Pt received leaving room to go for walk. No complaints of pain. Pt took a couple short standing rest breaks. Left EOB with call bell in reach and all needs met.   [facility] Suzzanne Brunkhorst Mobility Specialist

## 2021-11-14 NOTE — Discharge Instructions (Signed)
You have an appointment set up with the [facility].  Multiple studies have shown that being followed by a dedicated atrial fibrillation clinic in addition to the standard care you receive from your other physicians improves health. We believe that enrollment in the atrial fibrillation clinic will allow [place] to better care for you.   The phone number to the [facility] is [phone]. The clinic is staffed Monday through Friday from 8:30am to 5pm.  Parking Directions: The clinic is located in the Heart and Vascular Building connected to [facility]. 1)From [address] turn on to [address] and go to the 3rd entrance  (Heart and Vascular entrance) on the right. 2)Look to the right for Heart &Vascular Parking Garage. 3)A code for the entrance is required, for August is 1403.   4)Take the elevators to the 1st floor. Registration is in the room with the glass walls at the end of the hallway.  If you have any trouble parking or locating the clinic, please don't hesitate to call [phone].

## 2021-11-14 NOTE — ED Notes (Signed)
RT called for cardioversion and sedation procedure

## 2021-11-14 NOTE — Progress Notes (Signed)
Pharmacy Consult for Sotalol Electrolyte Replacement- Follow Up  Pharmacy consulted to assist in monitoring and replacing electrolytes in this 66 y.o. male admitted on 11/12/2021 undergoing sotalol initiation . First sotalol dose: 8/1 0000.  Labs:    Component Value Date/Time   K 4.1 11/13/2021 0430   MG 1.9 11/13/2021 0430     Plan: Potassium: K >/= 4: No additional supplementation needed  Magnesium: Mg 2.0. No additional supplements at this time.   Thank you for allowing pharmacy to participate in this patient's care   Erin Hearing PharmD., BCPS Clinical Pharmacist 11/14/2021 7:45 AM

## 2021-11-15 ENCOUNTER — Emergency Department (HOSPITAL_COMMUNITY): Payer: BC Managed Care – PPO

## 2021-11-15 ENCOUNTER — Encounter (HOSPITAL_COMMUNITY): Payer: Self-pay | Admitting: Internal Medicine

## 2021-11-15 ENCOUNTER — Other Ambulatory Visit: Payer: Self-pay

## 2021-11-15 DIAGNOSIS — R7989 Other specified abnormal findings of blood chemistry: Secondary | ICD-10-CM | POA: Diagnosis not present

## 2021-11-15 DIAGNOSIS — I429 Cardiomyopathy, unspecified: Secondary | ICD-10-CM | POA: Diagnosis not present

## 2021-11-15 DIAGNOSIS — I471 Supraventricular tachycardia: Secondary | ICD-10-CM

## 2021-11-15 LAB — CBC WITH DIFFERENTIAL/PLATELET
Abs Immature Granulocytes: 0.09 10*3/uL — ABNORMAL HIGH (ref 0.00–0.07)
Basophils Absolute: 0 10*3/uL (ref 0.0–0.1)
Basophils Relative: 0 %
Eosinophils Absolute: 0 10*3/uL (ref 0.0–0.5)
Eosinophils Relative: 0 %
HCT: 38.9 % — ABNORMAL LOW (ref 39.0–52.0)
Hemoglobin: 13.1 g/dL (ref 13.0–17.0)
Immature Granulocytes: 1 %
Lymphocytes Relative: 9 %
Lymphs Abs: 1.2 10*3/uL (ref 0.7–4.0)
MCH: 32.7 pg (ref 26.0–34.0)
MCHC: 33.7 g/dL (ref 30.0–36.0)
MCV: 97 fL (ref 80.0–100.0)
Monocytes Absolute: 1 10*3/uL (ref 0.1–1.0)
Monocytes Relative: 8 %
Neutro Abs: 10.6 10*3/uL — ABNORMAL HIGH (ref 1.7–7.7)
Neutrophils Relative %: 82 %
Platelets: 161 10*3/uL (ref 150–400)
RBC: 4.01 MIL/uL — ABNORMAL LOW (ref 4.22–5.81)
RDW: 13.2 % (ref 11.5–15.5)
WBC: 12.9 10*3/uL — ABNORMAL HIGH (ref 4.0–10.5)
nRBC: 0 % (ref 0.0–0.2)

## 2021-11-15 LAB — COMPREHENSIVE METABOLIC PANEL
ALT: 444 U/L — ABNORMAL HIGH (ref 0–44)
AST: 360 U/L — ABNORMAL HIGH (ref 15–41)
Albumin: 3.2 g/dL — ABNORMAL LOW (ref 3.5–5.0)
Alkaline Phosphatase: 78 U/L (ref 38–126)
Anion gap: 12 (ref 5–15)
BUN: 21 mg/dL (ref 8–23)
CO2: 19 mmol/L — ABNORMAL LOW (ref 22–32)
Calcium: 8.5 mg/dL — ABNORMAL LOW (ref 8.9–10.3)
Chloride: 102 mmol/L (ref 98–111)
Creatinine, Ser: 1.67 mg/dL — ABNORMAL HIGH (ref 0.61–1.24)
GFR, Estimated: 45 mL/min — ABNORMAL LOW (ref >60)
Glucose, Bld: 192 mg/dL — ABNORMAL HIGH (ref 70–99)
Potassium: 4.1 mmol/L (ref 3.5–5.1)
Sodium: 133 mmol/L — ABNORMAL LOW (ref 135–145)
Total Bilirubin: 3.5 mg/dL — ABNORMAL HIGH (ref 0.3–1.2)
Total Protein: 6.1 g/dL — ABNORMAL LOW (ref 6.5–8.1)

## 2021-11-15 LAB — BRAIN NATRIURETIC PEPTIDE: B Natriuretic Peptide: 319.7 pg/mL — ABNORMAL HIGH (ref 0.0–100.0)

## 2021-11-15 LAB — PROTIME-INR
INR: 1.9 — ABNORMAL HIGH (ref 0.8–1.2)
Prothrombin Time: 21.9 seconds — ABNORMAL HIGH (ref 11.4–15.2)

## 2021-11-15 LAB — TROPONIN I (HIGH SENSITIVITY)
Troponin I (High Sensitivity): 328 ng/L (ref <18)
Troponin I (High Sensitivity): 43 ng/L — ABNORMAL HIGH (ref <18)

## 2021-11-15 LAB — MAGNESIUM: Magnesium: 1.9 mg/dL (ref 1.7–2.4)

## 2021-11-15 MED ORDER — METOPROLOL SUCCINATE ER 25 MG PO TB24: 100.0000 mg | ORAL_TABLET | Freq: Every day | ORAL | Status: DC

## 2021-11-15 MED ORDER — SODIUM CHLORIDE 0.9 % IV SOLN: 250.0000 mL | INTRAVENOUS | Status: DC | PRN

## 2021-11-15 MED ORDER — CALCIUM POLYCARBOPHIL 625 MG PO TABS
625.0000 mg | ORAL_TABLET | Freq: Two times a day (BID) | ORAL | Status: DC
  Administered 2021-11-15 – 2021-11-18 (×6): 625 mg via ORAL
  Filled 2021-11-15 (×6): qty 1

## 2021-11-15 MED ORDER — SODIUM CHLORIDE 0.9% FLUSH: 3.0000 mL | INTRAVENOUS | Status: DC | PRN

## 2021-11-15 MED ORDER — SOTALOL HCL 80 MG PO TABS: 80.0000 mg | ORAL_TABLET | Freq: Every day | ORAL | Status: DC

## 2021-11-15 MED ORDER — SODIUM CHLORIDE 0.9% FLUSH
3.0000 mL | Freq: Two times a day (BID) | INTRAVENOUS | Status: DC
  Administered 2021-11-15 – 2021-11-17 (×6): 3 mL via INTRAVENOUS

## 2021-11-15 MED ORDER — METOPROLOL SUCCINATE ER 100 MG PO TB24
100.0000 mg | ORAL_TABLET | Freq: Two times a day (BID) | ORAL | Status: DC
  Administered 2021-11-15: 100 mg via ORAL
  Filled 2021-11-15: qty 1
  Filled 2021-11-15: qty 4

## 2021-11-15 MED ORDER — SODIUM CHLORIDE 0.9 % IV BOLUS
1000.0000 mL | Freq: Once | INTRAVENOUS | Status: AC
  Administered 2021-11-15: 1000 mL via INTRAVENOUS

## 2021-11-15 MED ORDER — TAMSULOSIN HCL 0.4 MG PO CAPS
0.4000 mg | ORAL_CAPSULE | Freq: Every evening | ORAL | Status: DC
  Administered 2021-11-16 – 2021-11-17 (×3): 0.4 mg via ORAL
  Filled 2021-11-15 (×3): qty 1

## 2021-11-15 MED ORDER — APIXABAN 5 MG PO TABS
5.0000 mg | ORAL_TABLET | Freq: Two times a day (BID) | ORAL | Status: DC
  Administered 2021-11-15 – 2021-11-18 (×7): 5 mg via ORAL
  Filled 2021-11-15 (×7): qty 1

## 2021-11-15 MED ORDER — APIXABAN 5 MG PO TABS
5.0000 mg | ORAL_TABLET | Freq: Two times a day (BID) | ORAL | Status: DC
Start: 2021-11-16 — End: 2021-11-15

## 2021-11-15 MED ORDER — ALBUTEROL SULFATE (2.5 MG/3ML) 0.083% IN NEBU
2.5000 mg | INHALATION_SOLUTION | Freq: Four times a day (QID) | RESPIRATORY_TRACT | Status: DC | PRN
Start: 2021-11-15 — End: 2021-11-18
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
## 2021-11-15 NOTE — ED Provider Notes (Addendum)
[facility] EMERGENCY DEPARTMENT Provider Note   CSN: [id number] Arrival date & time: 11/14/21  2343     History  Chief Complaint  Patient presents with   Tachycardia    Clayton Freeman is a 66 y.o. male.  Patient brought to the emergency department by EMS from home.  Patient transported for severe tachycardia.  Patient reports that he was recently hospitalized to initiate sotalol for atrial fibrillation.  He converted to sinus rhythm and was discharged.  He has been having intermittent episodes of fast heart rate.  He reports that his Fitbit has been telling him his heart rate went up to 220 briefly several times during the day.  Tonight, however, his heart rate went up and did not slow down.       Home Medications Prior to Admission medications   Medication Sig Start Date End Date Taking? Authorizing Provider  albuterol (VENTOLIN HFA) 108 (90 Base) MCG/ACT inhaler Inhale 2 puffs into the lungs every 6 (six) hours as needed for wheezing or shortness of breath. 01/12/21   Mar Daring, PA-C  apixaban (ELIQUIS) 5 MG TABS tablet Take 1 tablet (5 mg total) by mouth 2 (two) times daily. 10/22/21   Camnitz, Ocie Doyne, MD  EPINEPHrine 0.3 mg/0.3 mL IJ SOAJ injection Inject 0.3 mLs (0.3 mg total) into the muscle once as needed (anaphylaxis/allergic reaction). 02/13/18   Vivi Barrack, MD  lisinopril (ZESTRIL) 40 MG tablet TAKE 1 TABLET(40 MG) BY MOUTH DAILY Patient taking differently: Take 40 mg by mouth daily. 07/27/21   Vivi Barrack, MD  metoprolol succinate (TOPROL-XL) 100 MG 24 hr tablet Take 1 tablet (100 mg total) by mouth daily. Take with or immediately following a meal. Patient not taking: Reported on 11/12/2021 10/22/21   Constance Haw, MD  Multiple Vitamin (MULTIVITAMIN) tablet Take 1 tablet by mouth daily.    [provider]  Olopatadine HCl (PATADAY OP) Place 1 drop into both eyes daily.    [provider]  Omega-3 Fatty Acids  (FISH OIL) 1000 MG CAPS Take 1,000 mg by mouth daily at 6 (six) AM.    [provider]  sotalol (BETAPACE) 120 MG tablet Take 1 tablet (120 mg total) by mouth every 12 (twelve) hours. 11/14/21   Baldwin Jamaica, PA-C  tamsulosin (FLOMAX) 0.4 MG CAPS capsule Take 0.4 mg by mouth every evening. 12/07/20   [provider]  VITAMIN E PO Take 1 capsule by mouth daily.    [provider]      Allergies    Bee venom, Penicillins, Other, and Erythromycin    Review of Systems   Review of Systems  Physical Exam Updated Vital Signs BP 97/79   Pulse 77   Temp 98.7 F (37.1 C) (Oral)   Resp 20   Ht 6' (1.829 m)   Wt (!) 153.3 kg   SpO2 98%   BMI 45.84 kg/m  Physical Exam Vitals and nursing note reviewed.  Constitutional:      General: He is not in acute distress.    Appearance: He is well-developed.  HENT:     Head: Normocephalic and atraumatic.     Mouth/Throat:     Mouth: Mucous membranes are moist.  Eyes:     General: Vision grossly intact. Gaze aligned appropriately.     Extraocular Movements: Extraocular movements intact.     Conjunctiva/sclera: Conjunctivae normal.  Cardiovascular:     Rate and Rhythm: Regular rhythm. Tachycardia present.  Pulses: Normal pulses.     Heart sounds: Normal heart sounds, S1 normal and S2 normal. No murmur heard.    No friction rub. No gallop.  Pulmonary:     Effort: Pulmonary effort is normal. No respiratory distress.     Breath sounds: Normal breath sounds.  Abdominal:     Palpations: Abdomen is soft.     Tenderness: There is no abdominal tenderness. There is no guarding or rebound.     Hernia: No hernia is present.  Musculoskeletal:        General: No swelling.     Cervical back: Full passive range of motion without pain, normal range of motion and neck supple. No pain with movement, spinous process tenderness or muscular tenderness. Normal range of motion.     Right lower leg: No edema.     Left lower leg: No  edema.  Skin:    General: Skin is warm and dry.     Capillary Refill: Capillary refill takes less than 2 seconds.     Findings: No ecchymosis, erythema, lesion or wound.  Neurological:     Mental Status: He is alert and oriented to person, place, and time.     GCS: GCS eye subscore is 4. GCS verbal subscore is 5. GCS motor subscore is 6.     Cranial Nerves: Cranial nerves 2-12 are intact.     Sensory: Sensation is intact.     Motor: Motor function is intact. No weakness or abnormal muscle tone.     Coordination: Coordination is intact.  Psychiatric:        Mood and Affect: Mood normal.        Speech: Speech normal.        Behavior: Behavior normal.     ED Results / Procedures / Treatments   Labs (all labs ordered are listed, but only abnormal results are displayed) Labs Reviewed  CBC WITH DIFFERENTIAL/PLATELET - Abnormal; Notable for the following components:      Result Value   WBC 12.9 (*)    RBC 4.01 (*)    HCT 38.9 (*)    Neutro Abs 10.6 (*)    Abs Immature Granulocytes 0.09 (*)    All other components within normal limits  COMPREHENSIVE METABOLIC PANEL - Abnormal; Notable for the following components:   Sodium 133 (*)    CO2 19 (*)    Glucose, Bld 192 (*)    Creatinine, Ser 1.67 (*)    Calcium 8.5 (*)    Total Protein 6.1 (*)    Albumin 3.2 (*)    AST 360 (*)    ALT 444 (*)    Total Bilirubin 3.5 (*)    GFR, Estimated 45 (*)    All other components within normal limits  BRAIN NATRIURETIC PEPTIDE - Abnormal; Notable for the following components:   B Natriuretic Peptide 319.7 (*)    All other components within normal limits  TROPONIN I (HIGH SENSITIVITY) - Abnormal; Notable for the following components:   Troponin I (High Sensitivity) 43 (*)    All other components within normal limits  MAGNESIUM  PROTIME-INR  TROPONIN I (HIGH SENSITIVITY)    EKG EKG Interpretation  Date/Time:  Wednesday November 14 2021 23:56:12 EDT Ventricular Rate:  91 PR  Interval:  153 QRS Duration: 151 QT Interval:  368 QTC Calculation: 453 R Axis:   -69 Text Interpretation: Sinus rhythm Atrial premature complex RBBB and LAFB Confirmed by Orpah Greek [phone]) on 11/15/2021 1:50:39 AM  EKG Interpretation  Date/Time:  Wednesday November 14 2021 23:56:12 EDT Ventricular Rate:  91 PR Interval:  153 QRS Duration: 151 QT Interval:  368 QTC Calculation: 453 R Axis:   -69 Text Interpretation: Sinus rhythm Atrial premature complex RBBB and LAFB Confirmed by Orpah Greek [phone]) on 11/15/2021 1:50:39 AM         Radiology US ABDOMEN LIMITED RUQ (LIVER/GB)  Result Date: 11/15/2021 CLINICAL DATA:  Elevated LFTs EXAM: ULTRASOUND ABDOMEN LIMITED RIGHT UPPER QUADRANT COMPARISON:  None Available. FINDINGS: Gallbladder: No gallstones or wall thickening visualized. No sonographic Murphy sign noted by sonographer. Common bile duct: Diameter: 3.7 mm. Liver: Diffusely increased in echogenicity consistent with fatty infiltration. No focal mass is noted. Portal vein is patent on color Doppler imaging with normal direction of blood flow towards the liver. Other: None. IMPRESSION: Fatty liver.  No other focal abnormality is noted. Electronically Signed   By: Inez Catalina M.D.   On: 11/15/2021 02:14   DG Chest 2 View  Result Date: 11/13/2021 CLINICAL DATA:  Shortness of breath EXAM: CHEST - 2 VIEW COMPARISON:  11/10/2020 FINDINGS: Cardiac shadow is enlarged. Aortic calcifications are noted. The lungs are well aerated bilaterally. No focal infiltrate or effusion is seen. Mild degenerative changes of the thoracic spine are noted. Multiple old rib fractures are noted on the left. IMPRESSION: No active cardiopulmonary disease. Electronically Signed   By: Inez Catalina M.D.   On: 11/13/2021 19:14    Procedures Procedures    Medications Ordered in ED Medications  adenosine (ADENOCARD) 6 MG/2ML injection (  Not Given 11/15/21 0055)  metoprolol succinate (TOPROL-XL) 24  hr tablet 100 mg (has no administration in time range)  apixaban (ELIQUIS) tablet 5 mg (has no administration in time range)  albuterol (PROVENTIL) (2.5 MG/3ML) 0.083% nebulizer solution 2.5 mg (has no administration in time range)  sodium chloride flush (NS) 0.9 % injection 3 mL (has no administration in time range)  sodium chloride flush (NS) 0.9 % injection 3 mL (has no administration in time range)  0.9 %  sodium chloride infusion (has no administration in time range)  sodium chloride 0.9 % bolus 1,000 mL (1,000 mLs Intravenous New Bag/Given 11/15/21 0000)    ED Course/ Medical Decision Making/ A&P                           Medical Decision Making Amount and/or Complexity of Data Reviewed Labs: ordered. Radiology: ordered.  Risk Prescription drug management. Decision regarding hospitalization.   Patient presents to the emergency department for evaluation of extreme tachycardia.  Differential diagnosis includes atrial fibrillation, atrial flutter, SVT, ventricular tachycardia.  Patient transported to the emergency department by EMS from home.  He indicates that he has had several episodes of extreme tachycardia during the course of the day that were short-lived.  EMS did attempt SVT treatment with adenosine.  Tracings are available.  It appears that the patient did slow down, underlying rhythm looks like atrial fibrillation:      EMS treated as possible stable V. tach with amiodarone.  He has reportedly been stable with blood pressures in the low 100s during transport.  At arrival to the emergency department, patient has a tachycardia with a right bundle branch block pattern on EKG.  Inferior leads appear narrow complex with right bundle branch block pattern, not likely to be V. tach, but cannot rule out.  Patient noted to be hypotensive when he was placed on our  monitor, blood pressure 69 systolic.  He is still awake and alert.  Arrangements for DC cardioversion were being readied  when he spontaneously converted to sinus rhythm.  He has maintained sinus rhythm since spontaneous conversion.  Lab work reveals slightly elevated troponin, consistent with leak secondary to extreme tachycardia.  Patient also with elevated LFTs.  He is not experiencing abdominal pain.  This could be secondary to periods of hypotension secondary to his tachycardia prior to arrival.  Right upper quadrant ultrasound does not show any acute abnormality.  Discussed with cardiology, will see the patient.  CRITICAL CARE Performed by: Orpah Greek   Total critical care time: 33 minutes  Critical care time was exclusive of separately billable procedures and treating other patients.  Critical care was necessary to treat or prevent imminent or life-threatening deterioration.  Critical care was time spent personally by me on the following activities: development of treatment plan with patient and/or surrogate as well as nursing, discussions with consultants, evaluation of patient's response to treatment, examination of patient, obtaining history from patient or surrogate, ordering and performing treatments and interventions, ordering and review of laboratory studies, ordering and review of radiographic studies, pulse oximetry and re-evaluation of patient's condition.         Final Clinical Impression(s) / ED Diagnoses Final diagnoses:  Tachycardia    Rx / DC Orders ED Discharge Orders     None         Cameran Ahmed, Gwenyth Allegra, MD 11/15/21 0236    Orpah Greek, MD 11/27/21 [phone]
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
## 2021-11-16 DIAGNOSIS — I471 Supraventricular tachycardia: Secondary | ICD-10-CM | POA: Diagnosis not present

## 2021-11-16 LAB — CBC
HCT: 36.5 % — ABNORMAL LOW (ref 39.0–52.0)
Hemoglobin: 12.5 g/dL — ABNORMAL LOW (ref 13.0–17.0)
MCH: 32.5 pg (ref 26.0–34.0)
MCHC: 34.2 g/dL (ref 30.0–36.0)
MCV: 94.8 fL (ref 80.0–100.0)
Platelets: 143 10*3/uL — ABNORMAL LOW (ref 150–400)
RBC: 3.85 MIL/uL — ABNORMAL LOW (ref 4.22–5.81)
RDW: 13.1 % (ref 11.5–15.5)
WBC: 9.8 10*3/uL (ref 4.0–10.5)
nRBC: 0 % (ref 0.0–0.2)

## 2021-11-16 LAB — COMPREHENSIVE METABOLIC PANEL
ALT: 375 U/L — ABNORMAL HIGH (ref 0–44)
AST: 164 U/L — ABNORMAL HIGH (ref 15–41)
Albumin: 3 g/dL — ABNORMAL LOW (ref 3.5–5.0)
Alkaline Phosphatase: 70 U/L (ref 38–126)
Anion gap: 7 (ref 5–15)
BUN: 15 mg/dL (ref 8–23)
CO2: 24 mmol/L (ref 22–32)
Calcium: 8.7 mg/dL — ABNORMAL LOW (ref 8.9–10.3)
Chloride: 103 mmol/L (ref 98–111)
Creatinine, Ser: 0.86 mg/dL (ref 0.61–1.24)
GFR, Estimated: >60 mL/min (ref >60)
Glucose, Bld: 102 mg/dL — ABNORMAL HIGH (ref 70–99)
Potassium: 4 mmol/L (ref 3.5–5.1)
Sodium: 134 mmol/L — ABNORMAL LOW (ref 135–145)
Total Bilirubin: 1.4 mg/dL — ABNORMAL HIGH (ref 0.3–1.2)
Total Protein: 5.7 g/dL — ABNORMAL LOW (ref 6.5–8.1)

## 2021-11-16 LAB — MAGNESIUM: Magnesium: 1.7 mg/dL (ref 1.7–2.4)

## 2021-11-16 MED ORDER — METOPROLOL SUCCINATE ER 100 MG PO TB24
100.0000 mg | ORAL_TABLET | Freq: Two times a day (BID) | ORAL | Status: DC
  Administered 2021-11-16 – 2021-11-18 (×5): 100 mg via ORAL
  Filled 2021-11-16 (×5): qty 1

## 2021-11-16 NOTE — Progress Notes (Signed)
Patient is visibly SHoB with increased respirations. Patient was offered an albuterol inhaler. Patient declined treatment. Patient's continues to be in 110-120s at rest, oxygen saturation is 99% on RA. Patient is on 2L nasal cannula. Patient has less work of breathing at this time. Will continue to monitor for changes.

## 2021-11-16 NOTE — Progress Notes (Signed)
While Ambulating

## 2021-11-16 NOTE — Care Management (Addendum)
  Transition of Care (TOC) Screening Note   Patient Details  Name: Clayton Freeman Date of Birth: 10/30/1955   Transition of Care [facility]) CM/SW Contact:    Bethena Roys, RN Phone Number: 11/16/2021, 11:44 AM    Transition of Care Department [facility]) has reviewed the patient and no TOC needs have been identified at this time. Patient previously here this week for Sotalol Load and is presenting for palpitations. Case Manager will continue to monitor patient advancement through interdisciplinary progression rounds. If new patient transition needs arise, please place a TOC consult.  1151 11-16-21 Case Manager received a call from [facility] Case Manager @ [phone] for any d/c needs. If patient is d/c over the weekend and patient needs [place] can reach out to the General Nursing # @ [place] @ [phone]. Weekend Case Manage to follow for needs.

## 2021-11-16 NOTE — Progress Notes (Addendum)
Progress Note  Patient Name: Clayton Freeman Date of Encounter: 11/16/2021  [facility] Cardiologist: Clayton Grooms, MD   Subjective   No ongoing SOB, no DOE, no CP  Inpatient Medications    Scheduled Meds:  apixaban  5 mg Oral BID   metoprolol succinate  100 mg Oral BID   polycarbophil  625 mg Oral BID   sodium chloride flush  3 mL Intravenous Q12H   tamsulosin  0.4 mg Oral QPM   Continuous Infusions:  sodium chloride     PRN Meds: sodium chloride, albuterol, sodium chloride flush   Vital Signs    Vitals:   11/15/21 2320 11/15/21 2324 11/16/21 0430 11/16/21 0758  BP: 114/81 114/81 114/72 124/69  Pulse: (!) 113  (!) 112 (!) 118  Resp: (!) 24  (!) 21 20  Temp: 98.7 F (37.1 C)  98.8 F (37.1 C) 98 F (36.7 C)  TempSrc:   Oral Oral  SpO2: 99%  97% 98%  Weight:      Height:        Intake/Output Summary (Last 24 hours) at 11/16/2021 1040 Last data filed at 11/16/2021 0446 Gross per 24 hour  Intake --  Output 870 ml  Net -870 ml      11/15/2021   12:05 AM 11/12/2021    6:36 PM 10/22/2021    3:39 PM  Last 3 Weights  Weight (lbs) 338 lb 339 lb 6.4 oz 344 lb 9.6 oz  Weight (kg) 153.316 kg 153.951 kg 156.31 kg      Telemetry    90's-120's > 130's with exertion - Personally Reviewed  ECG    No new EKGs - Personally Reviewed  Physical Exam   GEN: No acute distress.   Neck: No JVD Cardiac: irreg-irreg, tachycardic, no murmurs, rubs, or gallops.  Respiratory: CTA b/l. GI: Soft, nontender, non-distended  MS: No edema; No deformity. Neuro:  Nonfocal  Psych: Normal affect   Labs    High Sensitivity Troponin:   Recent Labs  Lab 11/15/21 0012 11/15/21 0202  TROPONINIHS 43* 328*     Chemistry Recent Labs  Lab 11/12/21 2038 11/13/21 0430 11/14/21 0852 11/15/21 0012 11/16/21 0416  NA 139   < > 132* 133* 134*  K 3.9   < > 4.2 4.1 4.0  CL 104   < > 99 102 103  CO2 28   < > 24 19* 24  GLUCOSE 125*   < > 167* 192* 102*  BUN 11   < > '11 21  15  '$ CREATININE 1.13   < > 0.93 1.67* 0.86  CALCIUM 9.6   < > 8.9 8.5* 8.7*  MG 1.9   < > 2.0 1.9 1.7  PROT 7.0  --   --  6.1* 5.7*  ALBUMIN 3.8  --   --  3.2* 3.0*  AST 21  --   --  360* 164*  ALT 31  --   --  444* 375*  ALKPHOS 61  --   --  78 70  BILITOT 0.7  --   --  3.5* 1.4*  GFRNONAA >60   < > >60 45* >60  ANIONGAP 7   < > '9 12 7   '$ < > = values in this interval not displayed.    Lipids No results for input(s): "CHOL", "TRIG", "HDL", "LABVLDL", "LDLCALC", "CHOLHDL" in the last 168 hours.  Hematology Recent Labs  Lab 11/12/21 2038 11/15/21 0012 11/16/21 0416  WBC 11.5* 12.9* 9.8  RBC 4.60 4.01* 3.85*  HGB 14.8 13.1 12.5*  HCT 43.9 38.9* 36.5*  MCV 95.4 97.0 94.8  MCH 32.2 32.7 32.5  MCHC 33.7 33.7 34.2  RDW 13.2 13.2 13.1  PLT 219 161 143*   Thyroid  Recent Labs  Lab 11/12/21 2038  TSH 2.319    BNP Recent Labs  Lab 11/15/21 0012  BNP 319.7*    DDimer No results for input(s): "DDIMER" in the last 168 hours.   Radiology    US ABDOMEN LIMITED RUQ (LIVER/GB)  Result Date: 11/15/2021 CLINICAL DATA:  Elevated LFTs EXAM: ULTRASOUND ABDOMEN LIMITED RIGHT UPPER QUADRANT COMPARISON:  None Available. FINDINGS: Gallbladder: No gallstones or wall thickening visualized. No sonographic Murphy sign noted by sonographer. Common bile duct: Diameter: 3.7 mm. Liver: Diffusely increased in echogenicity consistent with fatty infiltration. No focal mass is noted. Portal vein is patent on color Doppler imaging with normal direction of blood flow towards the liver. Other: None. IMPRESSION: Fatty liver.  No other focal abnormality is noted. Electronically Signed   By: Clayton Freeman M.D.   On: 11/15/2021 02:14    Cardiac Studies   02/13/21: TTE 1. Left ventricular ejection fraction, by estimation, is 45%. The left  ventricle has mildly decreased function. The left ventricle demonstrates  global hypokinesis. There is mild left ventricular hypertrophy. Left  ventricular diastolic  parameters are  consistent with Grade II diastolic dysfunction (pseudonormalization).   2. Right ventricular systolic function is normal. The right ventricular  size is mildly enlarged. Tricuspid regurgitation signal is inadequate for  assessing PA pressure.   3. Left atrial size was mildly dilated.   4. Right atrial size was mildly dilated.   5. The mitral valve is normal in structure. Trivial mitral valve  regurgitation. No evidence of mitral stenosis.   6. The aortic valve is tricuspid. Aortic valve regurgitation is not  visualized. Mild aortic valve sclerosis is present, with no evidence of  aortic valve stenosis.   7. Aortic dilatation noted. There is mild dilatation of the aortic root,  measuring 41 mm.   8. The inferior vena cava is normal in size with greater than 50%  respiratory variability, suggesting right atrial pressure of 3 mmHg.   9. Technically difficult study with poor acoustic windows. EF  determination was difficult with both poor images and ventricular bigeminy  present, suspect at least mild hypokinesis.   Patient Profile     66 y.o. male HTN, morbid obesity, OSA, chronic CHF (combined), AFib, PVCs,  RBBB admitted with WCT/hypotension  He discharged the day before from sotalol load, at home started to have palpitations initially brief > sustained and very symptomatic and called 911 > found in [facility] and was given amiodarone in route via EMS in the ER as preparations for cardioversion were underway he converted to SR and then into Aflutter with better rates 90's-120s  WCT was a 1:1 AFlutter   Assessment & Plan    WCT is a 1:1 AFlutter Paroxysmal AFib Given recurrent arrhythmia Clayton Freeman stop sotalol Amiodarone his only other option for rhythm control Increase his Toprol to 100 BID    3. AKI 4. Abn LFTs improving Anticipate they Clayton Freeman return to baseline Once normalized plan for amiodarone PO load   5. Abnl HS Trops Demand 2/2 extreme tachycardia No CP with  better HR  5    CM 6.   Chronic CHF Suspect 2/2 AF/PVCs Mild elevation in BNP, eaxm though looks OK No need for repeat echo, do not suspect  ACS, was not a VT Clayton Freeman follow his BP on BID Toprol and decide his home ACE dose prior to d/c   7.   OSA CPAP for here   8.    RBBB His baseline   9. PVCs Rare if any here so far  10. Episodic SOB last night He does not have exam findings of volume OL Happened with his last stay earlier int he week with neg CXR O2 sats have been stable He suspects his sinuses   Planned to discharge today though HRs not ideal yet and pt does not feel quite ready to go home yet  For questions or updates, please contact [place] Please consult [email or web address] for contact info under        Signed, Baldwin Jamaica, PA-C  11/16/2021, 10:40 AM    I have seen and examined this patient with Tommye Standard.  Agree with above, note added to reflect my findings.  Patient feeling well, though he is continued to have episodes of tachycardia.  He is quite nervous about his tachycardia as he had a one-to-one atrial flutter previously.  GEN: Well nourished, well developed, in no acute distress  HEENT: normal  Neck: no JVD, carotid bruits, or masses Cardiac: Tachycardic, irregular; no murmurs, rubs, or gallops,no edema  Respiratory:  clear to auscultation bilaterally, normal work of breathing GI: soft, nontender, nondistended, + BS MS: no deformity or atrophy  Skin: warm and dry Neuro:  Strength and sensation are intact Psych: euthymic mood, full affect   Persistent atrial fibrillation/atrial flutter: Patient presented to the hospital with a one-to-one atrial flutter and a wide-complex tachycardia.  Sotalol was stopped.  Currently on Toprol-XL.  Shantae Vantol increase to 100 mg twice daily.  If his heart rates are better controlled, would plan for discharge tomorrow with follow-up in atrial fibrillation clinic for amiodarone load. PVCs: No further PVCs noted on ECG.  We  Shanina Kepple plan for amiodarone load as above Acute renal failure, elevated LFTs: Occurred as a consequence of his tachycardia.  Both are improving.  Zharia Conrow M. Breckan Cafiero MD 11/16/2021 2:47 PM

## 2021-11-16 NOTE — Discharge Summary (Signed)
DISCHARGE SUMMARY    Patient ID: Clayton Freeman,  MRN: [id number], DOB/AGE: 09/19/1955 66 y.o.  Admit date: 11/14/2021 Discharge date: 11/18/2021  Primary Care Physician: Vivi Barrack, MD Primary Cardiologist: Dr. Tamala Julian Electrophysiologist: Dr. 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 male was  discharged 8/2 after sotalol load with stable labs and EKG.  Once home he reported shortly afterwards started having bursts of fast HRs, very noticeable and very fast. These initially were intermittent and took his PM meds, including sotalol without improvement. Eventually became sustained making him feel terrible, SOB with heaviness in his chest, and called 911   EMS report was not available at the time of admission, ER notes reported he got a dose of amiodarone in route for WCT in the 200's He was hypotensive on arrival, given adenosine confirming AF underlying and while preparing to cardiovert had spontaneous conversion to SR.  WCT was a 1:1 Aflutter   Hospital Course:  The patient was admitted his labs noted AKI as well as elevated LFTs felt 2/2 extreme tachycardia and hypotension.  HS Trops abnormal though CP resolved with HR improvement and felt to be demand in setting of extreme tachycardia.  He has  not had further CP. He has been monitored on telemetry, with sustained atrial flutter but improved rates.   He again had some episodic SOB (similar to prior admission with normal CXR and O2 sats) again with stable BP, O2 sats and HR.  He did not appear volume OL  Ambulated without difficulty or unusual SOB. Renal function is back to baseline, LFTs remain elevated but improved., AST 360>>>74, ALT 444>>279 The patient denies any CP/SOB, he was examined by Dr. Quentin Ore and considered stable for discharge to home.   STOPPED sotalol Increased his Toprol to '100mg'$  BID Keep his AFib clinic visit this week as scheduled and plan for labs,  if his LFTs are normalized then consider load with PO amiodarone and look towards DCCV afterwards if needed   Physical Exam: Vitals:   11/17/21 2200 11/18/21 0500 11/18/21 0654 11/18/21 0741  BP: 101/62 121/73    Pulse: 74     Resp: '16 20  20  '$ Temp: 98.7 F (37.1 C)  99.4 F (37.4 C) 99 F (37.2 C)  TempSrc: Oral  Oral Oral  SpO2: 100%  98%   Weight:      Height:       Labs:   Lab Results  Component Value Date   WBC 9.8 11/16/2021   HGB 12.5 (L) 11/16/2021   HCT 36.5 (L) 11/16/2021   MCV 94.8 11/16/2021   PLT 143 (L) 11/16/2021    Recent Labs  Lab 11/18/21 0318  NA 130*  K 3.9  CL 98  CO2 24  BUN 10  CREATININE  0.85  CALCIUM 8.7*  PROT 6.0*  BILITOT 1.1  ALKPHOS 74  ALT 279*  AST 74*  GLUCOSE 132*    Discharge Medications:  Allergies as of 11/18/2021       Reactions   Bee Venom Anaphylaxis, Hives, Shortness Of Breath, Itching, Swelling, Palpitations, Rash   Penicillins Anaphylaxis, Hives, Shortness Of Breath, Itching, Swelling, Palpitations, Rash   Aspirin Other (See Comments)   Told to avoid due to taking Eliquis   Other Hives, Itching   Horse Radish - hives, itching   Erythromycin Hives, Swelling, Rash   r        Medication List     STOP taking these medications    lisinopril 40 MG tablet Commonly known as:  ZESTRIL   mexiletine 150 MG capsule Commonly known as: MEXITIL   sotalol 120 MG tablet Commonly known as: BETAPACE       TAKE these medications    acetaminophen 500 MG tablet Commonly known as: TYLENOL Take 500-1,000 mg by mouth daily as needed for headache or mild pain.   albuterol 108 (90 Base) MCG/ACT inhaler Commonly known as: VENTOLIN HFA Inhale 2 puffs into the lungs every 6 (six) hours as needed for wheezing or shortness of breath.   apixaban 5 MG Tabs tablet Commonly known as: ELIQUIS Take 1 tablet (5 mg total) by mouth 2 (two) times daily.   EPINEPHrine 0.3 mg/0.3 mL Soaj injection Commonly known as: EPI-PEN Inject 0.3 mLs (0.3 mg total) into the muscle once as needed (anaphylaxis/allergic reaction).   Fish Oil 1000 MG Caps Take 1,000 mg by mouth daily at 6 (six) AM.   METAMUCIL FIBER PO Take 2 Scoops by mouth 2 (two) times daily.   metoprolol succinate 100 MG 24 hr tablet Commonly known as: TOPROL-XL Take 1 tablet (100 mg total) by mouth 2 (two) times daily. Take with or immediately following a meal. What changed:  when to take this Another medication with the same name was removed. Continue taking this medication, and follow the directions you see here.   multivitamin tablet Take 1 tablet by mouth daily.   PATADAY OP Place 1 drop into both eyes daily as needed (itchy eyes).   tamsulosin 0.4 MG Caps capsule Commonly known as: FLOMAX Take 0.4 mg by mouth every evening.   VITAMIN E PO Take 1 capsule by mouth daily.        Disposition: Home   Follow-up Information     [facility] Follow up on 11/22/2021.   Specialty: Cardiology Why: at 10:30am for your follow up appt. Contact information: [address] [id number] [place] [address] [phone]                Duration of Discharge Encounter: Greater than 30 minutes including physician time.  Barnet Pall  NP-C 11/18/2021 10:31 AM

## 2021-11-17 DIAGNOSIS — I471 Supraventricular tachycardia: Secondary | ICD-10-CM | POA: Diagnosis not present

## 2021-11-17 LAB — COMPREHENSIVE METABOLIC PANEL
ALT: 339 U/L — ABNORMAL HIGH (ref 0–44)
AST: 109 U/L — ABNORMAL HIGH (ref 15–41)
Albumin: 3 g/dL — ABNORMAL LOW (ref 3.5–5.0)
Alkaline Phosphatase: 68 U/L (ref 38–126)
Anion gap: 7 (ref 5–15)
BUN: 12 mg/dL (ref 8–23)
CO2: 26 mmol/L (ref 22–32)
Calcium: 9 mg/dL (ref 8.9–10.3)
Chloride: 103 mmol/L (ref 98–111)
Creatinine, Ser: 0.88 mg/dL (ref 0.61–1.24)
GFR, Estimated: >60 mL/min (ref >60)
Glucose, Bld: 108 mg/dL — ABNORMAL HIGH (ref 70–99)
Potassium: 4.6 mmol/L (ref 3.5–5.1)
Sodium: 136 mmol/L (ref 135–145)
Total Bilirubin: 1.2 mg/dL (ref 0.3–1.2)
Total Protein: 6.2 g/dL — ABNORMAL LOW (ref 6.5–8.1)

## 2021-11-17 LAB — MAGNESIUM: Magnesium: 1.7 mg/dL (ref 1.7–2.4)

## 2021-11-17 MED ORDER — BACITRACIN ZINC 500 UNIT/GM EX OINT
TOPICAL_OINTMENT | Freq: Every day | CUTANEOUS | Status: DC
  Filled 2021-11-17 (×2): qty 28.4

## 2021-11-17 NOTE — Progress Notes (Signed)
Patient has abrasion on left upper back from where defib pads were removed previously.  Now with yellow drainage, no odor noted.  MD notified with new orders received.

## 2021-11-17 NOTE — Progress Notes (Signed)
Pt has hone CPAP, no assistance needed.

## 2021-11-17 NOTE — Progress Notes (Signed)
Progress Note  Patient Name: Clayton Freeman Date of Encounter: 11/17/2021  [facility] Cardiologist: [name], MD   Subjective   No acute events. Intermittent rapid VR over night while in AF/AFL.  Inpatient Medications    Scheduled Meds:  apixaban  5 mg Oral BID   metoprolol succinate  100 mg Oral BID   polycarbophil  625 mg Oral BID   sodium chloride flush  3 mL Intravenous Q12H   tamsulosin  0.4 mg Oral QPM   Continuous Infusions:  sodium chloride     PRN Meds: sodium chloride, albuterol, sodium chloride flush   Vital Signs    Vitals:   11/16/21 2001 11/16/21 2106 11/17/21 0030 11/17/21 0847  BP: (!) 148/94  114/75 118/73  Pulse: (!) 138 (!) 138 96 75  Resp: '18  16 16  '$ Temp: 97.9 F (36.6 C)  97.9 F (36.6 C) 98 F (36.7 C)  TempSrc: Oral  Oral Oral  SpO2: 98%   98%  Weight:      Height:        Intake/Output Summary (Last 24 hours) at 11/17/2021 0900 Last data filed at 11/17/2021 0800 Gross per 24 hour  Intake 240 ml  Output --  Net 240 ml      11/15/2021   12:05 AM 11/12/2021    6:36 PM 10/22/2021    3:39 PM  Last 3 Weights  Weight (lbs) 338 lb 339 lb 6.4 oz 344 lb 9.6 oz  Weight (kg) 153.316 kg 153.951 kg 156.31 kg      Telemetry    AF/AFL with rapid rates. BBB - Personally Reviewed  ECG    Personally Reviewed  Physical Exam   GEN: No acute distress.   Neck: No JVD Cardiac: irregularly irregular, no murmurs, rubs, or gallops.  Respiratory: Clear to auscultation bilaterally. GI: Soft, nontender, non-distended  MS: No edema; No deformity. Neuro:  Nonfocal  Psych: Normal affect   Labs    High Sensitivity Troponin:   Recent Labs  Lab 11/15/21 0012 11/15/21 0202  TROPONINIHS 43* 328*     Chemistry Recent Labs  Lab 11/15/21 0012 11/16/21 0416 11/17/21 0201  NA 133* 134* 136  K 4.1 4.0 4.6  CL 102 103 103  CO2 19* 24 26  GLUCOSE 192* 102* 108*  BUN '21 15 12  '$ CREATININE 1.67* 0.86 0.88  CALCIUM 8.5* 8.7* 9.0  MG  1.9 1.7 1.7  PROT 6.1* 5.7* 6.2*  ALBUMIN 3.2* 3.0* 3.0*  AST 360* 164* 109*  ALT 444* 375* 339*  ALKPHOS 78 70 68  BILITOT 3.5* 1.4* 1.2  GFRNONAA 45* >60 >60  ANIONGAP '12 7 7    '$ Lipids No results for input(s): "CHOL", "TRIG", "HDL", "LABVLDL", "LDLCALC", "CHOLHDL" in the last 168 hours.  Hematology Recent Labs  Lab 11/12/21 2038 11/15/21 0012 11/16/21 0416  WBC 11.5* 12.9* 9.8  RBC 4.60 4.01* 3.85*  HGB 14.8 13.1 12.5*  HCT 43.9 38.9* 36.5*  MCV 95.4 97.0 94.8  MCH 32.2 32.7 32.5  MCHC 33.7 33.7 34.2  RDW 13.2 13.2 13.1  PLT 219 161 143*   Thyroid  Recent Labs  Lab 11/12/21 2038  TSH 2.319    BNP Recent Labs  Lab 11/15/21 0012  BNP 319.7*    DDimer No results for input(s): "DDIMER" in the last 168 hours.   Radiology    No results found.    Assessment & Plan    [phone] man with HTN, obesity, OSA, chronic combined HF,  AF, PVCs and RBBB who was admitted with WCT and hypotension quickly after loading with sotalol.  #AF/AFL Off sotalol. Plan for amiodarone once LFTs improve.  Continue apixaban  #HFrEF Continue metoprolol Plan for GDMT as outpatient.  #HTN  Tentative discharge Sunday.   For questions or updates, please contact [place] Please consult [email or web address] for contact info under        Signed, Vickie Epley, MD  11/17/2021, 9:00 AM

## 2021-11-18 ENCOUNTER — Encounter (HOSPITAL_COMMUNITY): Payer: Self-pay | Admitting: Internal Medicine

## 2021-11-18 DIAGNOSIS — I471 Supraventricular tachycardia: Secondary | ICD-10-CM | POA: Diagnosis not present

## 2021-11-18 LAB — COMPREHENSIVE METABOLIC PANEL
ALT: 279 U/L — ABNORMAL HIGH (ref 0–44)
AST: 74 U/L — ABNORMAL HIGH (ref 15–41)
Albumin: 2.9 g/dL — ABNORMAL LOW (ref 3.5–5.0)
Alkaline Phosphatase: 74 U/L (ref 38–126)
Anion gap: 8 (ref 5–15)
BUN: 10 mg/dL (ref 8–23)
CO2: 24 mmol/L (ref 22–32)
Calcium: 8.7 mg/dL — ABNORMAL LOW (ref 8.9–10.3)
Chloride: 98 mmol/L (ref 98–111)
Creatinine, Ser: 0.85 mg/dL (ref 0.61–1.24)
GFR, Estimated: >60 mL/min (ref >60)
Glucose, Bld: 132 mg/dL — ABNORMAL HIGH (ref 70–99)
Potassium: 3.9 mmol/L (ref 3.5–5.1)
Sodium: 130 mmol/L — ABNORMAL LOW (ref 135–145)
Total Bilirubin: 1.1 mg/dL (ref 0.3–1.2)
Total Protein: 6 g/dL — ABNORMAL LOW (ref 6.5–8.1)

## 2021-11-18 LAB — MAGNESIUM: Magnesium: 1.7 mg/dL (ref 1.7–2.4)

## 2021-11-18 MED ORDER — METOPROLOL SUCCINATE ER 100 MG PO TB24: 100.0000 mg | ORAL_TABLET | Freq: Two times a day (BID) | ORAL | 1 refills | Status: DC

## 2021-11-18 NOTE — Progress Notes (Signed)
Progress Note  Patient Name: Clayton Freeman Date of Encounter: 11/18/2021  [facility] Cardiologist: [name], MD   Subjective   No acute events. HR much better controlled over night. Walking without symptoms or elevated rates.  Inpatient Medications    Scheduled Meds:  apixaban  5 mg Oral BID   bacitracin   Topical Daily   metoprolol succinate  100 mg Oral BID   polycarbophil  625 mg Oral BID   sodium chloride flush  3 mL Intravenous Q12H   tamsulosin  0.4 mg Oral QPM   Continuous Infusions:  sodium chloride     PRN Meds: sodium chloride, albuterol, sodium chloride flush   Vital Signs    Vitals:   11/17/21 2200 11/18/21 0500 11/18/21 0654 11/18/21 0741  BP: 101/62 121/73    Pulse: 74     Resp: '16 20  20  '$ Temp: 98.7 F (37.1 C)  99.4 F (37.4 C) 99 F (37.2 C)  TempSrc: Oral  Oral Oral  SpO2: 100%  98%   Weight:      Height:        Intake/Output Summary (Last 24 hours) at 11/18/2021 1008 Last data filed at 11/18/2021 0500 Gross per 24 hour  Intake 1080 ml  Output 575 ml  Net 505 ml       11/15/2021   12:05 AM 11/12/2021    6:36 PM 10/22/2021    3:39 PM  Last 3 Weights  Weight (lbs) 338 lb 339 lb 6.4 oz 344 lb 9.6 oz  Weight (kg) 153.316 kg 153.951 kg 156.31 kg      Telemetry    Sinus rhythm. - Personally Reviewed  ECG    Personally Reviewed  Physical Exam   GEN: No acute distress.   Neck: No JVD Cardiac: RRR, no murmurs, rubs, or gallops.  Respiratory: Clear to auscultation bilaterally. GI: Soft, nontender, non-distended  MS: No edema; No deformity. Neuro:  Nonfocal  Psych: Normal affect   Labs    High Sensitivity Troponin:   Recent Labs  Lab 11/15/21 0012 11/15/21 0202  TROPONINIHS 43* 328*      Chemistry Recent Labs  Lab 11/16/21 0416 11/17/21 0201 11/18/21 0318  NA 134* 136 130*  K 4.0 4.6 3.9  CL 103 103 98  CO2 '24 26 24  '$ GLUCOSE 102* 108* 132*  BUN '15 12 10  '$ CREATININE 0.86 0.88 0.85  CALCIUM 8.7* 9.0  8.7*  MG 1.7 1.7 1.7  PROT 5.7* 6.2* 6.0*  ALBUMIN 3.0* 3.0* 2.9*  AST 164* 109* 74*  ALT 375* 339* 279*  ALKPHOS 70 68 74  BILITOT 1.4* 1.2 1.1  GFRNONAA >60 >60 >60  ANIONGAP '7 7 8     '$ Lipids No results for input(s): "CHOL", "TRIG", "HDL", "LABVLDL", "LDLCALC", "CHOLHDL" in the last 168 hours.  Hematology Recent Labs  Lab 11/12/21 2038 11/15/21 0012 11/16/21 0416  WBC 11.5* 12.9* 9.8  RBC 4.60 4.01* 3.85*  HGB 14.8 13.1 12.5*  HCT 43.9 38.9* 36.5*  MCV 95.4 97.0 94.8  MCH 32.2 32.7 32.5  MCHC 33.7 33.7 34.2  RDW 13.2 13.2 13.1  PLT 219 161 143*    Thyroid  Recent Labs  Lab 11/12/21 2038  TSH 2.319     BNP Recent Labs  Lab 11/15/21 0012  BNP 319.7*     DDimer No results for input(s): "DDIMER" in the last 168 hours.   Radiology    No results found.    Assessment & Plan  66yo man with HTN, obesity, OSA, chronic combined HF, AF, PVCs and RBBB who was admitted with WCT and hypotension quickly after loading with sotalol.  #AF/AFL Off sotalol.  Continue metoprolol. Plan for early outpatient follow up with Dr Curt Bears for repeat CMP and to consider starting Amiodarone.  Continue apixaban  #HFrEF Continue metoprolol Hold ACE/ARB given BP Plan for GDMT consideration as outpatient.  #HTN  OK to discharge today with close outpatient follow up. He will need repeat CMP in 7-10 days.   For questions or updates, please contact [place] Please consult [email or web address] for contact info under        Signed, Vickie Epley, MD  11/18/2021, 10:08 AM

## 2021-11-19 DIAGNOSIS — G4733 Obstructive sleep apnea (adult) (pediatric): Secondary | ICD-10-CM | POA: Diagnosis not present

## 2021-11-22 ENCOUNTER — Ambulatory Visit (HOSPITAL_COMMUNITY)
Admit: 2021-11-22 | Discharge: 2021-11-22 | Disposition: A | Discharge status: Home / Self Care | Payer: BC Managed Care – PPO | Attending: Physician Assistant | Admitting: Physician Assistant

## 2021-11-22 VITALS — BP 124/60 | HR 74 | Ht 72.0 in | Wt 338.4 lb

## 2021-11-22 DIAGNOSIS — I493 Ventricular premature depolarization: Secondary | ICD-10-CM | POA: Diagnosis not present

## 2021-11-22 DIAGNOSIS — D6869 Other thrombophilia: Secondary | ICD-10-CM | POA: Diagnosis not present

## 2021-11-22 DIAGNOSIS — Z7901 Long term (current) use of anticoagulants: Secondary | ICD-10-CM | POA: Insufficient documentation

## 2021-11-22 DIAGNOSIS — G4733 Obstructive sleep apnea (adult) (pediatric): Secondary | ICD-10-CM | POA: Insufficient documentation

## 2021-11-22 DIAGNOSIS — I5042 Chronic combined systolic (congestive) and diastolic (congestive) heart failure: Secondary | ICD-10-CM | POA: Insufficient documentation

## 2021-11-22 DIAGNOSIS — I110 Hypertensive heart disease with heart failure: Secondary | ICD-10-CM | POA: Insufficient documentation

## 2021-11-22 DIAGNOSIS — I4510 Unspecified right bundle-branch block: Secondary | ICD-10-CM | POA: Diagnosis not present

## 2021-11-22 DIAGNOSIS — I484 Atypical atrial flutter: Secondary | ICD-10-CM | POA: Diagnosis not present

## 2021-11-22 DIAGNOSIS — I480 Paroxysmal atrial fibrillation: Secondary | ICD-10-CM | POA: Insufficient documentation

## 2021-11-22 DIAGNOSIS — I4891 Unspecified atrial fibrillation: Secondary | ICD-10-CM | POA: Insufficient documentation

## 2021-11-22 DIAGNOSIS — Z6841 Body Mass Index (BMI) 40.0 and over, adult: Secondary | ICD-10-CM | POA: Diagnosis not present

## 2021-11-22 DIAGNOSIS — I4892 Unspecified atrial flutter: Secondary | ICD-10-CM | POA: Diagnosis not present

## 2021-11-22 LAB — COMPREHENSIVE METABOLIC PANEL
ALT: 91 U/L — ABNORMAL HIGH (ref 0–44)
AST: 22 U/L (ref 15–41)
Albumin: 3 g/dL — ABNORMAL LOW (ref 3.5–5.0)
Alkaline Phosphatase: 71 U/L (ref 38–126)
Anion gap: 7 (ref 5–15)
BUN: 10 mg/dL (ref 8–23)
CO2: 27 mmol/L (ref 22–32)
Calcium: 9.1 mg/dL (ref 8.9–10.3)
Chloride: 103 mmol/L (ref 98–111)
Creatinine, Ser: 0.8 mg/dL (ref 0.61–1.24)
GFR, Estimated: >60 mL/min (ref >60)
Glucose, Bld: 160 mg/dL — ABNORMAL HIGH (ref 70–99)
Potassium: 4.1 mmol/L (ref 3.5–5.1)
Sodium: 137 mmol/L (ref 135–145)
Total Bilirubin: 0.5 mg/dL (ref 0.3–1.2)
Total Protein: 6.5 g/dL (ref 6.5–8.1)

## 2021-11-22 MED ORDER — AMIODARONE HCL 200 MG PO TABS: ORAL_TABLET | ORAL | 0 refills | Status: DC

## 2021-11-22 NOTE — Addendum Note (Signed)
Encounter addended by: Juluis Mire, RN on: 11/22/2021 2:12 PM  Actions taken: Order list changed
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
## 2021-12-19 ENCOUNTER — Other Ambulatory Visit (HOSPITAL_COMMUNITY): Payer: Self-pay | Admitting: Physician Assistant

## 2021-12-19 ENCOUNTER — Ambulatory Visit: Payer: BC Managed Care – PPO | Attending: Physician Assistant | Admitting: Physician Assistant

## 2021-12-19 ENCOUNTER — Encounter: Payer: Self-pay | Admitting: Physician Assistant

## 2021-12-19 VITALS — BP 128/88 | HR 59 | Ht 72.0 in | Wt 339.2 lb

## 2021-12-19 DIAGNOSIS — I480 Paroxysmal atrial fibrillation: Secondary | ICD-10-CM | POA: Diagnosis not present

## 2021-12-19 DIAGNOSIS — Z79899 Other long term (current) drug therapy: Secondary | ICD-10-CM

## 2021-12-19 DIAGNOSIS — I420 Dilated cardiomyopathy: Secondary | ICD-10-CM

## 2021-12-19 LAB — HEPATIC FUNCTION PANEL
ALT: 31 IU/L (ref 0–44)
AST: 19 IU/L (ref 0–40)
Albumin: 4.2 g/dL (ref 3.9–4.9)
Alkaline Phosphatase: 65 IU/L (ref 44–121)
Bilirubin Total: 0.6 mg/dL (ref 0.0–1.2)
Bilirubin, Direct: 0.19 mg/dL (ref 0.00–0.40)
Total Protein: 6.8 g/dL (ref 6.0–8.5)

## 2021-12-19 LAB — TSH: TSH: 4.49 u[IU]/mL (ref 0.450–4.500)

## 2021-12-19 MED ORDER — AMIODARONE HCL 200 MG PO TABS: 200.0000 mg | ORAL_TABLET | Freq: Every day | ORAL | 3 refills | Status: DC

## 2021-12-19 NOTE — Patient Instructions (Addendum)
Medication Instructions:   MAKE SURE YOU ARE TAKING AMIODARONE 200 MG ONCE  A DAY   *If you need a refill on your cardiac medications before your next appointment, please call your pharmacy*   Lab Work: LFT AND TSH TODAY    If you have labs (blood work) drawn today and your tests are completely normal, you will receive your results only by: MyChart Message (if you have MyChart) OR A paper copy in the mail If you have any lab test that is abnormal or we need to change your treatment, we will call you to review the results.   Testing/Procedures: NONE ORDERED  TODAY    Follow-Up: At [facility], you and your health needs are our priority.  As part of our continuing mission to provide you with exceptional heart care, we have created designated Provider Care Teams.  These Care Teams include your primary Cardiologist (physician) and Advanced Practice Providers (APPs -  Physician Assistants and Nurse Practitioners) who all work together to provide you with the care you need, when you need it.  We recommend signing up for the patient portal called "MyChart".  Sign up information is provided on this After Visit Summary.  MyChart is used to connect with patients for Virtual Visits (Telemedicine).  Patients are able to view lab/test results, encounter notes, upcoming appointments, etc.  Non-urgent messages can be sent to your provider as well.   To learn more about what you can do with MyChart, go to [email or web address].    Your next appointment:   6 week(s)  The format for your next appointment:   In Person  Provider:   Tommye Standard, PA-C   Other Instructions   Important Information About Sugar

## 2021-12-20 DIAGNOSIS — G4733 Obstructive sleep apnea (adult) (pediatric): Secondary | ICD-10-CM | POA: Diagnosis not present

## 2021-12-24 ENCOUNTER — Telehealth: Payer: Self-pay | Admitting: Internal Medicine

## 2021-12-24 NOTE — Telephone Encounter (Signed)
Good Morning Dr Hilarie Fredrickson,  Supervising MD 10/09/21 PM.  We have received a referral from patient primary care provider for patient to be seen for colon cancer screening.  Patient was last with [facility] on 10/02/2017 for a procedure with Dr Karleen Dolphin.  I am sending records for review along with a pathology report as well. Please review and advise on scheduling.  Thank you

## 2021-12-25 MED ORDER — APIXABAN 5 MG PO TABS: 5.0000 mg | ORAL_TABLET | Freq: Two times a day (BID) | ORAL | 1 refills | Status: DC

## 2021-12-31 NOTE — Telephone Encounter (Signed)
Good Morning Dr. Hilarie Fredrickson,   Patient called to follow up on transfer of care request.

## 2022-01-01 NOTE — Telephone Encounter (Signed)
I have reviewed all records and my in basket and I do not see the report of this colonoscopy from 2019.  I need this information to determine when surveillance exam is appropriate

## 2022-01-04 NOTE — Telephone Encounter (Signed)
Called patient to schedule per Dr. Hilarie Fredrickson left voicemail

## 2022-01-07 ENCOUNTER — Encounter: Payer: Self-pay | Admitting: *Deleted

## 2022-01-07 ENCOUNTER — Encounter: Payer: Self-pay | Admitting: Internal Medicine

## 2022-01-10 ENCOUNTER — Telehealth: Payer: Self-pay | Admitting: *Deleted

## 2022-01-10 ENCOUNTER — Encounter: Payer: Self-pay | Admitting: Internal Medicine

## 2022-01-10 ENCOUNTER — Encounter: Payer: Self-pay | Admitting: Nurse Practitioner

## 2022-01-10 NOTE — Telephone Encounter (Signed)
OV made with Colleen 02-11-22 at 3:00 pm

## 2022-01-10 NOTE — Telephone Encounter (Signed)
While prepping chart for on 01-22-22, noted pt is on Eliquis.  Attempted to reach pt to verify if this is true.  LMOM to call back.  Will schedule an OV prior to procedure if pt is truly on Eliquis

## 2022-01-15 DIAGNOSIS — I493 Ventricular premature depolarization: Secondary | ICD-10-CM

## 2022-01-19 DIAGNOSIS — G4733 Obstructive sleep apnea (adult) (pediatric): Secondary | ICD-10-CM | POA: Diagnosis not present

## 2022-02-11 ENCOUNTER — Ambulatory Visit (INDEPENDENT_AMBULATORY_CARE_PROVIDER_SITE_OTHER): Payer: BC Managed Care – PPO | Admitting: Nurse Practitioner

## 2022-02-11 ENCOUNTER — Encounter: Payer: Self-pay | Admitting: Nurse Practitioner

## 2022-02-11 ENCOUNTER — Telehealth: Payer: Self-pay

## 2022-02-11 ENCOUNTER — Other Ambulatory Visit (INDEPENDENT_AMBULATORY_CARE_PROVIDER_SITE_OTHER): Payer: BC Managed Care – PPO

## 2022-02-11 VITALS — BP 126/72 | HR 61 | Ht 72.0 in | Wt 343.1 lb

## 2022-02-11 DIAGNOSIS — Z860100 Personal history of colon polyps, unspecified: Secondary | ICD-10-CM

## 2022-02-11 DIAGNOSIS — D509 Iron deficiency anemia, unspecified: Secondary | ICD-10-CM

## 2022-02-11 LAB — CBC WITH DIFFERENTIAL/PLATELET
Basophils Absolute: 0.1 10*3/uL (ref 0.0–0.1)
Basophils Relative: 0.8 % (ref 0.0–3.0)
Eosinophils Absolute: 0.2 10*3/uL (ref 0.0–0.7)
Eosinophils Relative: 1.7 % (ref 0.0–5.0)
HCT: 42.6 % (ref 39.0–52.0)
Hemoglobin: 14.7 g/dL (ref 13.0–17.0)
Lymphocytes Relative: 21.9 % (ref 12.0–46.0)
Lymphs Abs: 1.9 10*3/uL (ref 0.7–4.0)
MCHC: 34.6 g/dL (ref 30.0–36.0)
MCV: 90.4 fl (ref 78.0–100.0)
Monocytes Absolute: 0.8 10*3/uL (ref 0.1–1.0)
Monocytes Relative: 8.7 % (ref 3.0–12.0)
Neutro Abs: 5.9 10*3/uL (ref 1.4–7.7)
Neutrophils Relative %: 66.9 % (ref 43.0–77.0)
Platelets: 168 10*3/uL (ref 150.0–400.0)
RBC: 4.71 Mil/uL (ref 4.22–5.81)
RDW: 15.2 % (ref 11.5–15.5)
WBC: 8.8 10*3/uL (ref 4.0–10.5)
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
## 2022-02-11 NOTE — Patient Instructions (Addendum)
Our office will contact you to verify your colonoscopy due date after I receive you 09/2017 colonoscopy procedure and biopsy results.  Your provider has requested that you go to the basement level for lab work before leaving today. Press "B" on the elevator. The lab is located at the first door on the left as you exit the elevator.   Due to recent changes in healthcare laws, you may see the results of your imaging and laboratory studies on MyChart before your provider has had a chance to review them.  We understand that in some cases there may be results that are confusing or concerning to you. Not all laboratory results come back in the same time frame and the provider may be waiting for multiple results in order to interpret others.  Please give [place] 48 hours in order for your provider to thoroughly review all the results before contacting the office for clarification of your results.    It was a pleasure to see you today!  Thank you for trusting me with your gastrointestinal care!
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
## 2022-02-13 ENCOUNTER — Encounter: Payer: Self-pay | Admitting: Physician Assistant

## 2022-02-13 ENCOUNTER — Telehealth: Payer: Self-pay | Admitting: Nurse Practitioner

## 2022-02-13 ENCOUNTER — Ambulatory Visit: Payer: BC Managed Care – PPO | Attending: Physician Assistant | Admitting: Physician Assistant

## 2022-02-13 ENCOUNTER — Telehealth: Payer: Self-pay

## 2022-02-13 ENCOUNTER — Ambulatory Visit: Payer: BC Managed Care – PPO | Admitting: Physician Assistant

## 2022-02-13 ENCOUNTER — Ambulatory Visit: Payer: BC Managed Care – PPO | Attending: Physician Assistant

## 2022-02-13 VITALS — BP 146/72 | HR 62 | Ht 72.0 in | Wt 345.0 lb

## 2022-02-13 DIAGNOSIS — I428 Other cardiomyopathies: Secondary | ICD-10-CM | POA: Diagnosis not present

## 2022-02-13 DIAGNOSIS — I480 Paroxysmal atrial fibrillation: Secondary | ICD-10-CM | POA: Diagnosis not present

## 2022-02-13 DIAGNOSIS — D6869 Other thrombophilia: Secondary | ICD-10-CM | POA: Diagnosis not present

## 2022-02-13 DIAGNOSIS — I493 Ventricular premature depolarization: Secondary | ICD-10-CM

## 2022-02-13 NOTE — Telephone Encounter (Signed)
[facility] Pre-operative Risk Assessment     Request for surgical clearance:     Endoscopy Procedure  What type of surgery is being performed?     Colonoscopy   When is this surgery scheduled?     02/21/22  What type of clearance is required ?   Pharmacy  Are there any medications that need to be held prior to surgery and how long? Eliquis x 2 days  Practice name and name of physician performing surgery?      [facility]  What is your office phone and fax number?      Phone- [phone]  Fax- [phone]  Anesthesia type (None, local, MAC, general) ?       MAC  

## 2022-02-13 NOTE — Telephone Encounter (Signed)
Clayton Freeman, refer to office visit 02/11/2022, pls follow up on requested cardiac clearance and Eliquis hold instructions as patient is scheduled for a colonoscopy 02/21/2022 with Dr. Norman Herrlich.

## 2022-02-13 NOTE — Telephone Encounter (Signed)
[facility] Pre-operative Risk Assessment     Request for surgical clearance:     Endoscopy Procedure  What type of surgery is being performed?     Colonoscopy   When is this surgery scheduled?     02/21/22  What type of clearance is required ?   Pharmacy  Are there any medications that need to be held prior to surgery and how long? Eliquis x 2 days  Practice name and name of physician performing surgery?      [facility]  What is your office phone and fax number?      Phone- [phone]  [phone]  Anesthesia type (None, local, MAC, general) ?       MAC

## 2022-02-13 NOTE — Patient Instructions (Signed)
Medication Instructions:   Your physician recommends that you continue on your current medications as directed. Please refer to the Current Medication list given to you today.   *If you need a refill on your cardiac medications before your next appointment, please call your pharmacy*   Lab Work: [place]    If you have labs (blood work) drawn today and your tests are completely normal, you will receive your results only by: [place] (if you have MyChart) OR A paper copy in the mail If you have any lab test that is abnormal or we need to change your treatment, we will call you to review the results.   Testing/Procedures: Your physician has recommended that you wear an event monitor. Event monitors are medical devices that record the heart's electrical activity. Doctors most often [place] these monitors to diagnose arrhythmias. Arrhythmias are problems with the speed or rhythm of the heartbeat. The monitor is a small, portable device. You can wear one while you do your normal daily activities. This is usually used to diagnose what is causing palpitations/syncope (passing out).     Follow-Up: At [facility], you and your health needs are our priority.  As part of our continuing mission to provide you with exceptional heart care, we have created designated Provider Care Teams.  These Care Teams include your primary Cardiologist (physician) and Advanced Practice Providers (APPs -  Physician Assistants and Nurse Practitioners) who all work together to provide you with the care you need, when you need it.  We recommend signing up for the patient portal called "MyChart".  Sign up information is provided on this After Visit Summary.  MyChart is used to connect with patients for Virtual Visits (Telemedicine).  Patients are able to view lab/test results, encounter notes, upcoming appointments, etc.  Non-urgent messages can be sent to your provider as well.   To learn more about  what you can do with MyChart, go to [email or web address].    Your next appointment:    4 month(s)  The format for your next appointment:    In Person  Provider:    You may see Dr. Curt Bears  or one of the following Advanced Practice Providers on your designated Care Team:   Tommye Standard, PA-C   Other Instructions  [place] Monitor Instructions  Your physician has requested you wear a ZIO patch monitor for 3  days.  This is a single patch monitor. [employer] supplies one patch monitor per enrollment. Additional stickers are not available. Please do not apply patch if you will be having a Nuclear Stress Test,  Echocardiogram, Cardiac CT, MRI, or Chest Xray during the period you would be wearing the  monitor. The patch cannot be worn during these tests. You cannot remove and re-apply the  ZIO XT patch monitor.  Your ZIO patch monitor will be mailed 3 day USPS to your address on file. It may take 3-5 days  to receive your monitor after you have been enrolled.  Once you have received your monitor, please review the enclosed instructions. Your monitor  has already been registered assigning a specific monitor serial # to you.  Billing and Patient Assistance Program Information  We have supplied [employer] with any of your insurance information on file for billing purposes. [employer] offers a sliding scale Patient Assistance Program for patients that do not have  insurance, or whose insurance does not completely cover the cost of the ZIO monitor.  You must apply  for the Patient Assistance Program to qualify for this discounted rate.  To apply, please call [employer] at [phone], select option 4, select option 2, ask to apply for  Patient Assistance Program. Theodore Demark will ask your household income, and how many people  are in your household. They will quote your out-of-pocket cost based on that information.  [employer] will also be able to set up a 25-month interest-free payment plan if  needed.  Applying the monitor   Shave hair from upper left chest.  Hold abrader disc by orange tab. Rub abrader in 40 strokes over the upper left chest as  indicated in your monitor instructions.  Clean area with 4 enclosed alcohol pads. Let dry.  Apply patch as indicated in monitor instructions. Patch will be placed under collarbone on left  side of chest with arrow pointing upward.  Rub patch adhesive wings for 2 minutes. Remove white label marked "1". Remove the white  label marked "2". Rub patch adhesive wings for 2 additional minutes.  While looking in a mirror, press and release button in center of patch. A small green light will  flash 3-4 times. This will be your only indicator that the monitor has been turned on.  Do not shower for the first 24 hours. You may shower after the first 24 hours.  Press the button if you feel a symptom. You will hear a small click. Record Date, Time and  Symptom in the Patient Logbook.  When you are ready to remove the patch, follow instructions on the last 2 pages of Patient  Logbook. Stick patch monitor onto the last page of Patient Logbook.  Place Patient Logbook in the blue and white box. Use locking tab on box and tape box closed  securely. The blue and white box has prepaid postage on it. Please place it in the mailbox as  soon as possible. Your physician should have your test results approximately 7 days after the  [facility]  Call [employer] [phone] you have questions regarding  your ZIO XT patch monitor. Call them immediately if you see an orange light blinking on your  monitor.  If your monitor falls off in less than 4 days, contact our Monitor department at [phone]  If your monitor becomes loose or falls off after 4 days call [employer] at [phone]  suggestions on securing your monitor  Important Information About [place]

## 2022-02-13 NOTE — Progress Notes (Unsigned)
Enrolled patient for a 3 day Zio XT monitor to patients home   Dr Curt Bears to read

## 2022-02-13 NOTE — Progress Notes (Signed)
Addendum: Reviewed and agree with assessment and management plan. I recommend given his personal history of numerous polyps at one point requiring 50-monthinterval colonoscopy as well as family history of polyposis that he keep his scheduled surveillance colonoscopy on 02/19/2022  Twanna Resh, JLajuan Lines MD

## 2022-02-14 ENCOUNTER — Other Ambulatory Visit: Payer: Self-pay

## 2022-02-14 ENCOUNTER — Telehealth: Payer: Self-pay

## 2022-02-14 DIAGNOSIS — Z86010 Personal history of colonic polyps: Secondary | ICD-10-CM

## 2022-02-14 MED ORDER — NA SULFATE-K SULFATE-MG SULF 17.5-3.13-1.6 GM/177ML PO SOLN: 1.0000 | Freq: Once | ORAL | 0 refills | Status: AC

## 2022-02-14 NOTE — Telephone Encounter (Signed)
Ambulatory referral to GI in epic. SUPREP sent to [facility] pharmacy on file. Colonoscopy instructions sent to patient via MyChart. Secure chat sent to pre-cert team since colonoscopy is next week.

## 2022-02-14 NOTE — Telephone Encounter (Signed)
Dr.Smith stated that it was ok to hold Eliquis for 2 days. Called patient and left voicemail.

## 2022-02-14 NOTE — Telephone Encounter (Signed)
Patient with diagnosis of afib on Eliquis for anticoagulation.    Procedure: colonoscopy Date of procedure: 02/21/22  CHA2DS2-VASc Score = 2   This indicates a 2.2% annual risk of stroke. The patient's score is based upon: CHF History: 1 HTN History: 1 Diabetes History: 0 Stroke History: 0 Vascular Disease History: 0 Age Score: 0 Gender Score: 0      CrCl >100 ml/min  Per office protocol, patient can hold Eliquis for 2 days prior to procedure.    **This guidance is not considered finalized until pre-operative APP has relayed final recommendations.**

## 2022-02-14 NOTE — Telephone Encounter (Signed)
Is it ok to proceed with the colonoscopy?

## 2022-02-15 ENCOUNTER — Telehealth: Payer: Self-pay | Admitting: Nurse Practitioner

## 2022-02-15 NOTE — Telephone Encounter (Signed)
Left message for pt to call back  °

## 2022-02-15 NOTE — Telephone Encounter (Signed)
Patient returned your call. Requesting a call back.

## 2022-02-15 NOTE — Telephone Encounter (Signed)
Patient re

## 2022-02-15 NOTE — Telephone Encounter (Signed)
Clayton Freeman, pls inform patient that we received cardiac clearance, he shall proceed with his colonoscopy with Dr. Hilarie Fredrickson 02/21/2022 as planned. Pls make sure he is aware to hold his Eliquis for 2 days prior to his procedure date. Thanks

## 2022-02-15 NOTE — Telephone Encounter (Signed)
Pt made aware of  received cardiac clearance, shall proceed with his colonoscopy with Dr. Hilarie Fredrickson 02/21/2022 as planned.  He is aware to hold his Eliquis for 2 days prior to his procedure date.  Pt verbalized understanding with all questions answered.

## 2022-02-15 NOTE — Telephone Encounter (Signed)
   Patient Name: Clayton Freeman  DOB: Aug 19, 1955 MRN: [id number]  Primary Cardiologist: [name], MD  Chart reviewed as part of pre-operative protocol coverage. Given past medical history and time since last visit, based on ACC/AHA guidelines, Clayton Freeman is at acceptable risk for the planned procedure without further cardiovascular testing.     Per office protocol, patient can hold Eliquis for 2 days prior to procedure.  Please resume Eliquis as soon as possible postprocedure, at the discretion of the surgeon.   I will route this recommendation to the requesting party via Epic fax function and remove from pre-op pool.  Please call with questions.  Lenna Sciara, NP 02/15/2022, 1:54 PM

## 2022-02-16 ENCOUNTER — Encounter: Payer: Self-pay | Admitting: Certified Registered Nurse Anesthetist

## 2022-02-18 ENCOUNTER — Encounter: Payer: Self-pay | Admitting: Internal Medicine

## 2022-02-19 DIAGNOSIS — G4733 Obstructive sleep apnea (adult) (pediatric): Secondary | ICD-10-CM | POA: Diagnosis not present

## 2022-02-20 DIAGNOSIS — G4733 Obstructive sleep apnea (adult) (pediatric): Secondary | ICD-10-CM | POA: Diagnosis not present

## 2022-02-21 ENCOUNTER — Encounter: Payer: BC Managed Care – PPO | Admitting: Internal Medicine

## 2022-02-21 ENCOUNTER — Ambulatory Visit (AMBULATORY_SURGERY_CENTER): Payer: BC Managed Care – PPO | Admitting: Internal Medicine

## 2022-02-21 ENCOUNTER — Encounter: Payer: Self-pay | Admitting: Internal Medicine

## 2022-02-21 VITALS — BP 114/62 | HR 57 | Temp 98.0°F | Resp 14 | Ht 72.0 in | Wt 343.0 lb

## 2022-02-21 DIAGNOSIS — Z86010 Personal history of colonic polyps: Secondary | ICD-10-CM | POA: Diagnosis not present

## 2022-02-21 DIAGNOSIS — D124 Benign neoplasm of descending colon: Secondary | ICD-10-CM

## 2022-02-21 DIAGNOSIS — Z09 Encounter for follow-up examination after completed treatment for conditions other than malignant neoplasm: Secondary | ICD-10-CM | POA: Diagnosis not present

## 2022-02-21 DIAGNOSIS — Z1211 Encounter for screening for malignant neoplasm of colon: Secondary | ICD-10-CM | POA: Diagnosis not present

## 2022-02-21 DIAGNOSIS — D122 Benign neoplasm of ascending colon: Secondary | ICD-10-CM

## 2022-02-21 MED ORDER — CLOTRIMAZOLE-BETAMETHASONE 1-0.05 % EX CREA: TOPICAL_CREAM | CUTANEOUS | 0 refills | Status: DC

## 2022-02-21 MED ORDER — SODIUM CHLORIDE 0.9 % IV SOLN: 500.0000 mL | INTRAVENOUS | Status: DC

## 2022-02-21 NOTE — Progress Notes (Signed)
Report given to PACU, vss 

## 2022-02-21 NOTE — Patient Instructions (Signed)
YOU HAD AN ENDOSCOPIC PROCEDURE TODAY AT [facility]:   Refer to the procedure report that was given to you for any specific questions about what was found during the examination.  If the procedure report does not answer your questions, please call your gastroenterologist to clarify.  If you requested that your care partner not be given the details of your procedure findings, then the procedure report has been included in a sealed envelope for you to review at your convenience later.  **Handouts given on polyps and diverticulosis**  YOU SHOULD EXPECT: Some feelings of bloating in the abdomen. Passage of more gas than usual.  Walking can help get rid of the air that was put into your GI tract during the procedure and reduce the bloating. If you had a lower endoscopy (such as a colonoscopy or flexible sigmoidoscopy) you may notice spotting of blood in your stool or on the toilet paper. If you underwent a bowel prep for your procedure, you may not have a normal bowel movement for a few days.  Please Note:  You might notice some irritation and congestion in your nose or some drainage.  This is from the oxygen used during your procedure.  There is no need for concern and it should clear up in a day or so.  SYMPTOMS TO REPORT IMMEDIATELY:  Following lower endoscopy (colonoscopy or flexible sigmoidoscopy):  Excessive amounts of blood in the stool  Significant tenderness or worsening of abdominal pains  Swelling of the abdomen that is new, acute  Fever of 100F or higher   For urgent or emergent issues, a gastroenterologist can be reached at any hour by calling [phone]. Do not use MyChart messaging for urgent concerns.    DIET:  We do recommend a small meal at first, but then you may proceed to your regular diet.  Drink plenty of fluids but you should avoid alcoholic beverages for 24 hours.  ACTIVITY:  You should plan to take it easy for the rest of today and you should NOT  DRIVE or use heavy machinery until tomorrow (because of the sedation medicines used during the test).    FOLLOW UP: Our staff will call the number listed on your records the next business day following your procedure.  We will call around 7:15- 8:00 am to check on you and address any questions or concerns that you may have regarding the information given to you following your procedure. If we do not reach you, we will leave a message.     If any biopsies were taken you will be contacted by phone or by letter within the next 1-3 weeks.  Please call us at [phone] if you have not heard about the biopsies in 3 weeks.    SIGNATURES/CONFIDENTIALITY: You and/or your care partner have signed paperwork which will be entered into your electronic medical record.  These signatures attest to the fact that that the information above on your After Visit Summary has been reviewed and is understood.  Full responsibility of the confidentiality of this discharge information lies with you and/or your care-partner.
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
## 2022-02-21 NOTE — Op Note (Signed)
Clayton Freeman Procedure Date: 02/21/2022 7:30 AM MRN: [id number] Endoscopist: Jerene Bears , MD, [id number] Age: 66 Referring MD:  Date of Birth: June 24, 1955 Gender: Male Account #: [id number] Procedure:                Colonoscopy Indications:              High risk colon cancer surveillance: Personal                            history of colonic polyps, Family history of                            multiple colon polyps, last exam in [place] in 2019 Medicines:                Monitored Anesthesia Care Procedure:                Pre-Anesthesia Assessment:                           - Prior to the procedure, a History and Physical                            was performed, and patient medications and                            allergies were reviewed. The patient's tolerance of                            previous anesthesia was also reviewed. The risks                            and benefits of the procedure and the sedation                            options and risks were discussed with the patient.                            All questions were answered, and informed consent                            was obtained. Prior Anticoagulants: The patient has                            taken Eliquis (apixaban), last dose was 2 days                            prior to procedure. ASA Grade Assessment: III - A                            patient with severe systemic disease. After                            reviewing the risks and benefits, the patient was  deemed in satisfactory condition to undergo the                            procedure.                           After obtaining informed consent, the colonoscope                            was passed under direct vision. Throughout the                            procedure, the patient's blood pressure, pulse, and                            oxygen saturations were monitored continuously.  The                            Olympus CF-HQ190L [id number]) Colonoscope was                            introduced through the anus and advanced to the                            colocolonic anastomosis created status post left                            colectomy. The colonoscopy was performed without                            difficulty. The patient tolerated the procedure                            well. The quality of the bowel preparation was                            good. The ileocecal valve, appendiceal orifice, and                            rectum were photographed. Scope In: 7:54:23 AM Scope Out: 8:12:42 AM Scope Withdrawal Time: 0 hours 15 minutes 5 seconds  Total Procedure Duration: 0 hours 18 minutes 19 seconds  Findings:                 The perianal exam findings include a perianal                            fungal rash.                           Six sessile polyps were found in the ascending                            colon. The polyps were 3 to 6 mm in size. These  polyps were removed with a cold snare. Resection                            and retrieval were complete.                           A tattoo was seen in the ascending colon.                           There was a small lipoma, in the proximal rectum                            and in the ascending colon.                           A 5 mm polyp was found in the descending colon. The                            polyp was sessile. The polyp was removed with a                            cold snare. Resection and retrieval were complete.                           Many large-mouthed and small-mouthed diverticula                            were found from cecum to sigmoid colon.                           The retroflexed view of the distal rectum and anal                            verge was normal and showed no anal or rectal                            abnormalities.                            The entire examined colon appeared normal. Complications:            No immediate complications. Estimated Blood Loss:     Estimated blood loss was minimal. Impression:               - Perianal fungal rash found on perianal exam.                           - Six 3 to 6 mm polyps in the ascending colon,                            removed with a cold snare. Resected and retrieved.                           - A tattoo was seen in the ascending  colon.                           - Small lipomas in the proximal rectum and in the                            ascending colon.                           - One 5 mm polyp in the descending colon, removed                            with a cold snare. Resected and retrieved.                           - Severe diverticulosis from cecum to sigmoid colon.                           - The distal rectum and anal verge are normal on                            retroflexion view. Recommendation:           - Patient has a contact number available for                            emergencies. The signs and symptoms of potential                            delayed complications were discussed with the                            patient. Return to normal activities tomorrow.                            Written discharge instructions were provided to the                            patient.                           - Resume previous diet.                           - Continue present medications.                           - Await pathology results.                           - Repeat colonoscopy date to be determined after                            pending pathology results are reviewed for  surveillance.                           - Resume Eliquis (apixaban) at prior dose today.                            Refer to managing physician for further adjustment                            of therapy. Jerene Bears, MD 02/21/2022 8:18:01 AM This report has  been signed electronically.

## 2022-02-21 NOTE — Progress Notes (Signed)
Called to room to assist during endoscopic procedure.  Patient ID and intended procedure confirmed with present staff. Received instructions for my participation in the procedure from the performing physician.  

## 2022-02-22 NOTE — Telephone Encounter (Signed)
  Follow up Call-     02/21/2022    7:06 AM  Call back number  Post procedure Call Back phone  # [phone]  Permission to leave phone message Yes     Follow up call, LVM

## 2022-02-26 ENCOUNTER — Encounter: Payer: Self-pay | Admitting: Internal Medicine

## 2022-03-14 DIAGNOSIS — N401 Enlarged prostate with lower urinary tract symptoms: Secondary | ICD-10-CM | POA: Diagnosis not present

## 2022-03-14 DIAGNOSIS — R350 Frequency of micturition: Secondary | ICD-10-CM | POA: Diagnosis not present

## 2022-03-14 DIAGNOSIS — R3912 Poor urinary stream: Secondary | ICD-10-CM | POA: Diagnosis not present

## 2022-03-21 DIAGNOSIS — G4733 Obstructive sleep apnea (adult) (pediatric): Secondary | ICD-10-CM | POA: Diagnosis not present

## 2022-03-22 DIAGNOSIS — G4733 Obstructive sleep apnea (adult) (pediatric): Secondary | ICD-10-CM | POA: Diagnosis not present

## 2022-04-21 DIAGNOSIS — G4733 Obstructive sleep apnea (adult) (pediatric): Secondary | ICD-10-CM | POA: Diagnosis not present

## 2022-04-22 DIAGNOSIS — G4733 Obstructive sleep apnea (adult) (pediatric): Secondary | ICD-10-CM | POA: Diagnosis not present

## 2022-05-04 ENCOUNTER — Telehealth: Payer: BC Managed Care – PPO | Admitting: Nurse Practitioner

## 2022-05-04 DIAGNOSIS — U071 COVID-19: Secondary | ICD-10-CM

## 2022-05-04 MED ORDER — PROMETHAZINE-DM 6.25-15 MG/5ML PO SYRP: 5.0000 mL | ORAL_SOLUTION | Freq: Four times a day (QID) | ORAL | 0 refills | Status: DC | PRN

## 2022-05-04 MED ORDER — MOLNUPIRAVIR EUA 200MG CAPSULE: 4.0000 | ORAL_CAPSULE | Freq: Two times a day (BID) | ORAL | 0 refills | Status: AC

## 2022-05-04 NOTE — Patient Instructions (Signed)
Carmin Richmond, thank you for joining Chevis Pretty, FNP for today's virtual visit.  While this provider is not your primary care provider (PCP), if your PCP is located in our provider database this encounter information will be shared with them immediately following your visit.   [place] account gives you access to today's visit and all your visits, tests, and labs performed at [facility] " click here if you don't have a [facility] account or go to [email or web address]  Consent: (Patient) Clayton Freeman provided verbal consent for this virtual visit at the beginning of the encounter.  Current Medications:  Current Outpatient Medications:    molnupiravir EUA (LAGEVRIO) 200 mg CAPS capsule, Take 4 capsules (800 mg total) by mouth 2 (two) times daily for 5 days., Disp: 40 capsule, Rfl: 0   promethazine-dextromethorphan (PROMETHAZINE-DM) 6.25-15 MG/5ML syrup, Take 5 mLs by mouth 4 (four) times daily as needed for cough., Disp: 118 mL, Rfl: 0   acetaminophen (TYLENOL) 500 MG tablet, Take 500-1,000 mg by mouth daily as needed for headache or mild pain., Disp: , Rfl:    albuterol (VENTOLIN HFA) 108 (90 Base) MCG/ACT inhaler, Inhale 2 puffs into the lungs every 6 (six) hours as needed for wheezing or shortness of breath., Disp: 8 g, Rfl: 0   amiodarone (PACERONE) 200 MG tablet, Take 1 tablet (200 mg total) by mouth daily., Disp: 90 tablet, Rfl: 3   apixaban (ELIQUIS) 5 MG TABS tablet, Take 1 tablet (5 mg total) by mouth 2 (two) times daily., Disp: 180 tablet, Rfl: 1   clotrimazole-betamethasone (LOTRISONE) cream, Use twice daily for 7-10 days, Disp: 30 g, Rfl: 0   EPINEPHrine 0.3 mg/0.3 mL IJ SOAJ injection, Inject 0.3 mLs (0.3 mg total) into the muscle once as needed (anaphylaxis/allergic reaction)., Disp: 2 Device, Rfl: 0   METAMUCIL FIBER PO, Take 2 Scoops by mouth 2 (two) times daily., Disp: , Rfl:    metoprolol succinate (TOPROL-XL) 100 MG 24 hr  tablet, Take 1 tablet (100 mg total) by mouth 2 (two) times daily. Take with or immediately following a meal., Disp: 180 tablet, Rfl: 1   Multiple Vitamin (MULTIVITAMIN) tablet, Take 1 tablet by mouth daily., Disp: , Rfl:    Olopatadine HCl (PATADAY OP), Place 1 drop into both eyes daily as needed (itchy eyes)., Disp: , Rfl:    Omega-3 Fatty Acids (FISH OIL) 1000 MG CAPS, Take 1,000 mg by mouth daily at 6 (six) AM., Disp: , Rfl:    tamsulosin (FLOMAX) 0.4 MG CAPS capsule, Take 0.4 mg by mouth every evening., Disp: , Rfl:    VITAMIN E PO, Take 1 capsule by mouth daily., Disp: , Rfl:    Medications ordered in this encounter:  Meds ordered this encounter  Medications   molnupiravir EUA (LAGEVRIO) 200 mg CAPS capsule    Sig: Take 4 capsules (800 mg total) by mouth 2 (two) times daily for 5 days.    Dispense:  40 capsule    Refill:  0    Order Specific Question:   Supervising Provider    Answer:   Chase Picket [id number]   promethazine-dextromethorphan (PROMETHAZINE-DM) 6.25-15 MG/5ML syrup    Sig: Take 5 mLs by mouth 4 (four) times daily as needed for cough.    Dispense:  118 mL    Refill:  0    Order Specific Question:   Supervising Provider    Answer:   Chase Picket [id number]     *If you need refills  on other medications prior to your next appointment, please contact your pharmacy*  Follow-Up: Call back or seek an in-person evaluation if the symptoms worsen or if the condition fails to improve as anticipated.  [place] [phone]  Other Instructions 1. Take meds as prescribed 2. Use a cool mist humidifier especially during the winter months and when heat has been humid. 3. Use saline nose sprays frequently 4. Saline irrigations of the nose can be very helpful if done frequently.  * 4X daily for 1 week*  * Use of a nettie pot can be helpful with this. Follow directions with this* 5. Drink plenty of fluids 6. Keep thermostat turn down low 7.For any  cough or congestion- promethazine  8. For fever or aces or pains- take tylenol or ibuprofen appropriate for age and weight.  * for fevers greater than 101 orally you may alternate ibuprofen and tylenol every  3 hours.      If you have been instructed to have an in-person evaluation today at a local Urgent Care facility, please use the link below. It will take you to a list of all of our available [facility], including address, phone number and hours of operation. Please do not delay care.  [facility]  If you or a family member do not have a primary care provider, use the link below to schedule a visit and establish care. When you choose a [facility] primary care physician or advanced practice provider, you gain a long-term partner in health. Find a Primary Care Provider  Learn more about [place]'s in-office and virtual care options: [facility] [place] [facility]

## 2022-05-04 NOTE — Progress Notes (Signed)
Virtual Visit Consent   Clayton Freeman, you are scheduled for a virtual visit with Mary-Margaret Hassell Done, [place], a [facility] provider, today.     Just as with appointments in the office, your consent must be obtained to participate.  Your consent will be active for this visit and any virtual visit you may have with one of our providers in the next 365 days.     If you have a MyChart account, a copy of this consent can be sent to you electronically.  All virtual visits are billed to your insurance company just like a traditional visit in the office.    As this is a virtual visit, video technology does not allow for your provider to perform a traditional examination.  This may limit your provider's ability to fully assess your condition.  If your provider identifies any concerns that need to be evaluated in person or the need to arrange testing (such as labs, EKG, etc.), we will make arrangements to do so.     Although advances in technology are sophisticated, we cannot ensure that it will always work on either your end or our end.  If the connection with a video visit is poor, the visit may have to be switched to a telephone visit.  With either a video or telephone visit, we are not always able to ensure that we have a secure connection.     I need to obtain your verbal consent now.   Are you willing to proceed with your visit today? YES   Paulino Cork has provided verbal consent on 05/04/2022 for a virtual visit (video or telephone).   Mary-Margaret Hassell Done, FNP   Date: 05/04/2022 1:26 PM   Virtual Visit via Video Note   I, Mary-Margaret Hassell Done, connected with Clayton Freeman ([id number], November 03, 1955) on 05/04/22 at  1:30 PM EST by a video-enabled telemedicine application and verified that I am speaking with the correct person using two identifiers.  Location: Patient: Virtual Visit Location Patient: Home Provider: Virtual Visit Location Provider: Mobile   I discussed the limitations  of evaluation and management by telemedicine and the availability of in person appointments. The patient expressed understanding and agreed to proceed.    History of Present Illness: Clayton Freeman is a 67 y.o. who identifies as a male who was assigned male at birth, and is being seen today for covid positive .  HPI: URI  The problem has been gradually worsening. There has been no fever. Associated symptoms include congestion, coughing, headaches, rhinorrhea and a sore throat. Treatments tried: nyquil. The treatment provided mild relief.    Review of Systems  HENT:  Positive for congestion, rhinorrhea and sore throat.   Respiratory:  Positive for cough.   Neurological:  Positive for headaches.    Problems:  Patient Active Problem List   Diagnosis Date Noted   Atypical atrial flutter ([place]) 11/22/2021   Secondary hypercoagulable state ([place]) 11/22/2021   SVT (supraventricular tachycardia) 11/15/2021   Atrial fibrillation, unspecified type ([place]) 11/12/2021   Sleep apnea in adult 04/18/2021   SOB (shortness of breath) 02/21/2021   New onset atrial fibrillation ([place]) 11/11/2020   Hyperglycemia 05/29/2020   Patellofemoral arthritis of right knee 10/13/2018   Morbid obesity ([place]) 02/13/2018   History of anaphylaxis 02/13/2018   Knee pain 02/13/2018   FAP (familial adenomatous polyposis)    Dyslipidemia 07/11/2015   Essential (primary) hypertension 04/15/2004    Allergies:  Allergies  Allergen Reactions   Bee Venom Anaphylaxis, Hives, Shortness  Of Breath, Itching, Swelling, Palpitations and Rash   Penicillins Anaphylaxis, Hives, Shortness Of Breath, Itching, Swelling, Palpitations and Rash   Aspirin Other (See Comments)    Told to avoid due to taking Eliquis   Other Hives and Itching    Horse Radish - hives, itching   Erythromycin Hives, Swelling and Rash    r   Medications:  Current Outpatient Medications:    acetaminophen (TYLENOL) 500 MG tablet, Take 500-1,000 mg by mouth  daily as needed for headache or mild pain., Disp: , Rfl:    albuterol (VENTOLIN HFA) 108 (90 Base) MCG/ACT inhaler, Inhale 2 puffs into the lungs every 6 (six) hours as needed for wheezing or shortness of breath., Disp: 8 g, Rfl: 0   amiodarone (PACERONE) 200 MG tablet, Take 1 tablet (200 mg total) by mouth daily., Disp: 90 tablet, Rfl: 3   apixaban (ELIQUIS) 5 MG TABS tablet, Take 1 tablet (5 mg total) by mouth 2 (two) times daily., Disp: 180 tablet, Rfl: 1   clotrimazole-betamethasone (LOTRISONE) cream, Use twice daily for 7-10 days, Disp: 30 g, Rfl: 0   EPINEPHrine 0.3 mg/0.3 mL IJ SOAJ injection, Inject 0.3 mLs (0.3 mg total) into the muscle once as needed (anaphylaxis/allergic reaction)., Disp: 2 Device, Rfl: 0   METAMUCIL FIBER PO, Take 2 Scoops by mouth 2 (two) times daily., Disp: , Rfl:    metoprolol succinate (TOPROL-XL) 100 MG 24 hr tablet, Take 1 tablet (100 mg total) by mouth 2 (two) times daily. Take with or immediately following a meal., Disp: 180 tablet, Rfl: 1   Multiple Vitamin (MULTIVITAMIN) tablet, Take 1 tablet by mouth daily., Disp: , Rfl:    Olopatadine HCl (PATADAY OP), Place 1 drop into both eyes daily as needed (itchy eyes)., Disp: , Rfl:    Omega-3 Fatty Acids (FISH OIL) 1000 MG CAPS, Take 1,000 mg by mouth daily at 6 (six) AM., Disp: , Rfl:    tamsulosin (FLOMAX) 0.4 MG CAPS capsule, Take 0.4 mg by mouth every evening., Disp: , Rfl:    VITAMIN E PO, Take 1 capsule by mouth daily., Disp: , Rfl:   Observations/Objective: Patient is well-developed, well-nourished in no acute distress.  Resting comfortably  at home.  Head is normocephalic, atraumatic.  No labored breathing.  Speech is clear and coherent with logical content.  Patient is alert and oriented at baseline.  Raspy voice Deep dry cough  Assessment and Plan:  Clayton Freeman in today with chief complaint of coivd positive   1. Positive self-administered antigen test for COVID-19 1. Take meds as  prescribed 2. Use a cool mist humidifier especially during the winter months and when heat has been humid. 3. Use saline nose sprays frequently 4. Saline irrigations of the nose can be very helpful if done frequently.  * 4X daily for 1 week*  * Use of a nettie pot can be helpful with this. Follow directions with this* 5. Drink plenty of fluids 6. Keep thermostat turn down low 7.For any cough or congestion- promethazine DM  8. For fever or aces or pains- take tylenol or ibuprofen appropriate for age and weight.  * for fevers greater than 101 orally you may alternate ibuprofen and tylenol every  3 hours.   Meds ordered this encounter  Medications   molnupiravir EUA (LAGEVRIO) 200 mg CAPS capsule    Sig: Take 4 capsules (800 mg total) by mouth 2 (two) times daily for 5 days.    Dispense:  40 capsule  Refill:  0    Order Specific Question:   Supervising Provider    Answer:   Chase Picket [id number]   promethazine-dextromethorphan (PROMETHAZINE-DM) 6.25-15 MG/5ML syrup    Sig: Take 5 mLs by mouth 4 (four) times daily as needed for cough.    Dispense:  118 mL    Refill:  0    Order Specific Question:   Supervising Provider    Answer:   Chase Picket [[id number]]       Follow Up Instructions: I discussed the assessment and treatment plan with the patient. The patient was provided an opportunity to ask questions and all were answered. The patient agreed with the plan and demonstrated an understanding of the instructions.  A copy of instructions were sent to the patient via MyChart.  The patient was advised to call back or seek an in-person evaluation if the symptoms worsen or if the condition fails to improve as anticipated.  Time:  I spent 7 minutes with the patient via telehealth technology discussing the above problems/concerns.    Mary-Margaret Hassell Done, FNP

## 2022-05-12 ENCOUNTER — Encounter: Payer: Self-pay | Admitting: Family Medicine

## 2022-05-13 NOTE — Telephone Encounter (Signed)
Please advise 

## 2022-05-13 NOTE — Telephone Encounter (Signed)
IF his symptoms are improving we do not need to do anything else. The test may read positive for up to 14 days or more but this does not indicate an active infection.   HE should let us know if symptoms are not improving or are worsening.  Algis Greenhouse. Jerline Pain, MD 05/13/2022 1:33 PM

## 2022-05-16 ENCOUNTER — Other Ambulatory Visit: Payer: Self-pay | Admitting: Cardiology

## 2022-05-19 ENCOUNTER — Encounter: Payer: Self-pay | Admitting: Cardiology

## 2022-05-20 MED ORDER — METOPROLOL SUCCINATE ER 100 MG PO TB24: 100.0000 mg | ORAL_TABLET | Freq: Two times a day (BID) | ORAL | 1 refills | Status: DC

## 2022-05-20 NOTE — Telephone Encounter (Signed)
Spoke to pt. Apologized for the incorrect Rx being sent in. Aware that I am sending in correct Rx that reflects he is taking it BID, along w/ placing Rx under Dr. Macky Lower name. Pt appreciates the help with this matter.

## 2022-05-22 DIAGNOSIS — G4733 Obstructive sleep apnea (adult) (pediatric): Secondary | ICD-10-CM | POA: Diagnosis not present

## 2022-05-25 DIAGNOSIS — G4733 Obstructive sleep apnea (adult) (pediatric): Secondary | ICD-10-CM | POA: Diagnosis not present

## 2022-06-04 ENCOUNTER — Other Ambulatory Visit: Payer: Self-pay | Admitting: Internal Medicine

## 2022-06-04 DIAGNOSIS — Z86010 Personal history of colonic polyps: Secondary | ICD-10-CM

## 2022-06-04 MED ORDER — CLOTRIMAZOLE-BETAMETHASONE 1-0.05 % EX CREA: TOPICAL_CREAM | CUTANEOUS | 0 refills | Status: DC
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
## 2022-06-19 ENCOUNTER — Ambulatory Visit: Payer: BC Managed Care – PPO | Attending: Cardiology | Admitting: Cardiology

## 2022-06-19 ENCOUNTER — Encounter: Payer: Self-pay | Admitting: Cardiology

## 2022-06-19 VITALS — BP 130/84 | HR 54 | Ht 72.0 in | Wt 352.0 lb

## 2022-06-19 DIAGNOSIS — I4819 Other persistent atrial fibrillation: Secondary | ICD-10-CM

## 2022-06-19 DIAGNOSIS — I493 Ventricular premature depolarization: Secondary | ICD-10-CM | POA: Diagnosis not present

## 2022-06-19 DIAGNOSIS — D6869 Other thrombophilia: Secondary | ICD-10-CM | POA: Diagnosis not present

## 2022-06-19 DIAGNOSIS — G4733 Obstructive sleep apnea (adult) (pediatric): Secondary | ICD-10-CM

## 2022-06-19 DIAGNOSIS — I5022 Chronic systolic (congestive) heart failure: Secondary | ICD-10-CM

## 2022-06-19 DIAGNOSIS — Z79899 Other long term (current) drug therapy: Secondary | ICD-10-CM | POA: Diagnosis not present

## 2022-06-19 NOTE — Patient Instructions (Addendum)
Medication Instructions:  Your physician recommends that you continue on your current medications as directed. Please refer to the Current Medication list given to you today.  *If you need a refill on your cardiac medications before your next appointment, please call your pharmacy*   Lab Work: Amiodarone surveillance labs: TSH & LFTs  If you have labs (blood work) drawn today and your tests are completely normal, you will receive your results only by: [place] (if you have MyChart) OR A paper copy in the mail If you have any lab test that is abnormal or we need to change your treatment, we will call you to review the results.   Testing/Procedures: Your physician has requested that you have an echocardiogram. Echocardiography is a painless test that uses sound waves to create images of your heart. It provides your doctor with information about the size and shape of your heart and how well your heart's chambers and valves are working. This procedure takes approximately one hour. There are no restrictions for this procedure. Please do NOT wear cologne, perfume, aftershave, or lotions (deodorant is allowed). Please arrive 15 minutes prior to your appointment time.   Follow-Up: At [facility], you and your health needs are our priority.  As part of our continuing mission to provide you with exceptional heart care, we have created designated Provider Care Teams.  These Care Teams include your primary Cardiologist (physician) and Advanced Practice Providers (APPs -  Physician Assistants and Nurse Practitioners) who all work together to provide you with the care you need, when you need it.  Your next appointment:   6 month(s)  The format for your next appointment:   In Person  Provider:   Allegra Lai, MD    Thank you for choosing [facility]!!   Trinidad Curet, RN [phone]

## 2022-06-20 ENCOUNTER — Other Ambulatory Visit: Payer: Self-pay | Admitting: *Deleted

## 2022-06-20 DIAGNOSIS — I4891 Unspecified atrial fibrillation: Secondary | ICD-10-CM

## 2022-06-20 LAB — HEPATIC FUNCTION PANEL
ALT: 35 IU/L (ref 0–44)
AST: 18 IU/L (ref 0–40)
Albumin: 4.5 g/dL (ref 3.9–4.9)
Alkaline Phosphatase: 70 IU/L (ref 44–121)
Bilirubin Total: 0.9 mg/dL (ref 0.0–1.2)
Bilirubin, Direct: 0.22 mg/dL (ref 0.00–0.40)
Total Protein: 7.4 g/dL (ref 6.0–8.5)

## 2022-06-20 LAB — TSH: TSH: 3.39 u[IU]/mL (ref 0.450–4.500)

## 2022-06-20 MED ORDER — APIXABAN 5 MG PO TABS: 5.0000 mg | ORAL_TABLET | Freq: Two times a day (BID) | ORAL | 1 refills | Status: DC

## 2022-06-20 NOTE — Telephone Encounter (Signed)
Eliquis '5mg'$  refill request received. Patient is 67 years old, weight-159.7kg, Crea-0.80 on 11/22/21, Diagnosis-Afib, and last seen by Dr. Curt Bears on 06/19/22 . Dose is appropriate based on dosing criteria. Will send in refill to requested pharmacy.

## 2022-06-23 DIAGNOSIS — G4733 Obstructive sleep apnea (adult) (pediatric): Secondary | ICD-10-CM | POA: Diagnosis not present

## 2022-07-24 DIAGNOSIS — G4733 Obstructive sleep apnea (adult) (pediatric): Secondary | ICD-10-CM | POA: Diagnosis not present

## 2022-07-29 ENCOUNTER — Ambulatory Visit (HOSPITAL_COMMUNITY): Payer: BC Managed Care – PPO | Attending: Cardiology

## 2022-07-29 DIAGNOSIS — I493 Ventricular premature depolarization: Secondary | ICD-10-CM | POA: Insufficient documentation

## 2022-07-29 DIAGNOSIS — I472 Ventricular tachycardia, unspecified: Secondary | ICD-10-CM

## 2022-07-29 DIAGNOSIS — I4819 Other persistent atrial fibrillation: Secondary | ICD-10-CM | POA: Insufficient documentation

## 2022-07-29 LAB — ECHOCARDIOGRAM COMPLETE
Area-P 1/2: 3.51 cm2
S' Lateral: 4.5 cm

## 2022-07-29 MED ORDER — PERFLUTREN LIPID MICROSPHERE
1.0000 mL | INTRAVENOUS | Status: AC | PRN
  Administered 2022-07-29: 3 mL via INTRAVENOUS

## 2022-08-26 DIAGNOSIS — G4733 Obstructive sleep apnea (adult) (pediatric): Secondary | ICD-10-CM | POA: Diagnosis not present

## 2022-09-22 ENCOUNTER — Other Ambulatory Visit: Payer: Self-pay | Admitting: Internal Medicine

## 2022-09-22 DIAGNOSIS — Z86010 Personal history of colonic polyps: Secondary | ICD-10-CM

## 2022-09-23 ENCOUNTER — Encounter: Payer: Self-pay | Admitting: Family Medicine

## 2022-09-23 ENCOUNTER — Encounter: Payer: Self-pay | Admitting: Internal Medicine

## 2022-09-24 ENCOUNTER — Other Ambulatory Visit: Payer: Self-pay

## 2022-09-24 MED ORDER — NYSTATIN 100000 UNIT/GM EX OINT: 1.0000 | TOPICAL_OINTMENT | Freq: Three times a day (TID) | CUTANEOUS | 0 refills | Status: DC

## 2022-09-24 NOTE — Telephone Encounter (Signed)
Have him try nystatin ointment only 3 times a day for 7 to 10 days If recurrent, intermittently, he can use nystatin at that time.  However, if he has this issue more often than not I would recommend he see dermatology

## 2022-09-24 NOTE — Telephone Encounter (Signed)
See note

## 2022-09-25 ENCOUNTER — Ambulatory Visit: Payer: BC Managed Care – PPO | Admitting: Podiatry

## 2022-09-25 ENCOUNTER — Ambulatory Visit (INDEPENDENT_AMBULATORY_CARE_PROVIDER_SITE_OTHER): Payer: BC Managed Care – PPO

## 2022-09-25 DIAGNOSIS — M79671 Pain in right foot: Secondary | ICD-10-CM

## 2022-09-25 DIAGNOSIS — M722 Plantar fascial fibromatosis: Secondary | ICD-10-CM | POA: Diagnosis not present

## 2022-09-25 MED ORDER — METHYLPREDNISOLONE 4 MG PO TBPK: ORAL_TABLET | ORAL | 0 refills | Status: DC

## 2022-09-25 MED ORDER — BETAMETHASONE SOD PHOS & ACET 6 (3-3) MG/ML IJ SUSP: 3.0000 mg | Freq: Once | INTRAMUSCULAR | Status: DC
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
## 2022-09-25 NOTE — Telephone Encounter (Signed)
Can we have him schedule an appointment to discuss?   Katina Degree. Jimmey Ralph, MD

## 2022-09-25 NOTE — Telephone Encounter (Signed)
Can we have him schedule an appointment to discuss?  Katina Degree. Jimmey Ralph, MD 09/25/2022 10:31 AM

## 2022-09-26 DIAGNOSIS — G4733 Obstructive sleep apnea (adult) (pediatric): Secondary | ICD-10-CM | POA: Diagnosis not present

## 2022-09-27 ENCOUNTER — Ambulatory Visit: Payer: BC Managed Care – PPO | Admitting: Family Medicine

## 2022-09-27 ENCOUNTER — Encounter: Payer: Self-pay | Admitting: Family Medicine

## 2022-09-27 VITALS — BP 134/71 | HR 51 | Temp 97.8°F | Ht 72.0 in | Wt 357.2 lb

## 2022-09-27 DIAGNOSIS — Z87892 Personal history of anaphylaxis: Secondary | ICD-10-CM | POA: Diagnosis not present

## 2022-09-27 DIAGNOSIS — I10 Essential (primary) hypertension: Secondary | ICD-10-CM

## 2022-09-27 DIAGNOSIS — N401 Enlarged prostate with lower urinary tract symptoms: Secondary | ICD-10-CM

## 2022-09-27 DIAGNOSIS — N400 Enlarged prostate without lower urinary tract symptoms: Secondary | ICD-10-CM | POA: Insufficient documentation

## 2022-09-27 MED ORDER — EPINEPHRINE 0.3 MG/0.3ML IJ SOAJ: 0.3000 mg | Freq: Once | INTRAMUSCULAR | 0 refills | Status: AC | PRN

## 2022-09-27 MED ORDER — KETOCONAZOLE 2 % EX CREA: 1.0000 | TOPICAL_CREAM | Freq: Two times a day (BID) | CUTANEOUS | 5 refills | Status: DC

## 2022-09-27 NOTE — Assessment & Plan Note (Signed)
Follows with urology.  On Flomax 0.4mg daily.  

## 2022-09-27 NOTE — Patient Instructions (Signed)
It was very nice to see you today!  You have a yeast infection.  Start the ketoconazole.  Use this twice daily for the next 1 to 2 weeks and let us know if it is not proving.  Please keep the area as dry as possible.  You can use Nizoral once or twice a week to prevent flareups.  Return if symptoms worsen or fail to improve.   Take care, Dr Jimmey Ralph  PLEASE NOTE:  If you had any lab tests, please let us know if you have not heard back within a few days. You may see your results on mychart before we have a chance to review them but we will give you a call once they are reviewed by [place].   If we ordered any referrals today, please let us know if you have not heard from their office within the next week.   If you had any urgent prescriptions sent in today, please check with the pharmacy within an hour of our visit to make sure the prescription was transmitted appropriately.   Please try these tips to maintain a healthy lifestyle:  Eat at least 3 REAL meals and 1-2 snacks per day.  Aim for no more than 5 hours between eating.  If you eat breakfast, please do so within one hour of getting up.   Each meal should contain half fruits/vegetables, one quarter protein, and one quarter carbs (no bigger than a computer mouse)  Cut down on sweet beverages. This includes juice, soda, and sweet tea.   Drink at least 1 glass of water with each meal and aim for at least 8 glasses per day  Exercise at least 150 minutes every week.

## 2022-09-27 NOTE — Progress Notes (Signed)
   Clayton Freeman is a 67 y.o. male who presents today for an office visit.  Assessment/Plan:  New/Acute Problems: Tinea Cruris  No red flags.  Rash consistent with tinea cruris.  We will start topical ketoconazole twice daily for the next 1 to 2 weeks.  He will let us know if not improving and would consider course of oral antifungals at that time.  We discussed importance of keeping the area dry.  Can use Nizoral shampoo to prevent recurrences as well.  Chronic Problems Addressed Today: Essential (primary) hypertension At goal on metoprolol succinate 100 mg twice daily.  BPH (benign prostatic hyperplasia) Follows with urology.  On Flomax 0.4 mg daily.  History of anaphylaxis Stable.  No recent flares.  Will refill EpiPen today.     Subjective:  HPI:  See A/P for status of chronic conditions.  Patient is here today with rash. This has been intermittent for the last several months. When he had a colonoscopy last year his gastroenterologist physician prescribed him clotrimazole-betamethasone. This worked well however rash tends to recur. He has been walking a lot more recently and is also having some urinary incontinence at baseline.        Objective:  Physical Exam: BP 134/71   Pulse (!) 51   Temp 97.8 F (36.6 C) (Temporal)   Ht 6' (1.829 m)   Wt (!) 357 lb 3.2 oz (162 kg)   SpO2 95%   BMI 48.45 kg/m   Gen: No acute distress, resting comfortably CV: Regular rate and rhythm with no murmurs appreciated Pulm: Normal work of breathing, clear to auscultation bilaterally with no crackles, wheezes, or rhonchi Skin: Macerated rash on gluteal cleft.  Neuro: Grossly normal, moves all extremities Psych: Normal affect and thought content      Clayton Freeman M. Jimmey Ralph, MD 09/27/2022 9:19 AM

## 2022-09-27 NOTE — Assessment & Plan Note (Signed)
Stable.  No recent flares.  Will refill EpiPen today.

## 2022-09-27 NOTE — Assessment & Plan Note (Signed)
At goal on metoprolol succinate 100mg twice daily.  

## 2022-10-23 ENCOUNTER — Encounter: Payer: Self-pay | Admitting: Podiatry

## 2022-10-23 ENCOUNTER — Ambulatory Visit: Payer: BC Managed Care – PPO | Admitting: Podiatry

## 2022-10-23 VITALS — BP 148/74 | HR 62

## 2022-10-23 DIAGNOSIS — M722 Plantar fascial fibromatosis: Secondary | ICD-10-CM | POA: Diagnosis not present
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
## 2022-10-25 ENCOUNTER — Other Ambulatory Visit (HOSPITAL_COMMUNITY): Payer: Self-pay

## 2022-10-26 DIAGNOSIS — G4733 Obstructive sleep apnea (adult) (pediatric): Secondary | ICD-10-CM | POA: Diagnosis not present

## 2022-11-04 MED ORDER — BETAMETHASONE SOD PHOS & ACET 6 (3-3) MG/ML IJ SUSP
3.0000 mg | Freq: Once | INTRAMUSCULAR | Status: AC
Start: 2022-11-04 — End: ?

## 2022-11-08 ENCOUNTER — Encounter (HOSPITAL_BASED_OUTPATIENT_CLINIC_OR_DEPARTMENT_OTHER): Payer: Self-pay | Admitting: Pulmonary Disease

## 2022-11-08 ENCOUNTER — Ambulatory Visit (HOSPITAL_BASED_OUTPATIENT_CLINIC_OR_DEPARTMENT_OTHER): Payer: BC Managed Care – PPO | Admitting: Pulmonary Disease

## 2022-11-08 VITALS — BP 118/76 | HR 62 | Resp 13 | Ht 72.0 in | Wt 353.0 lb

## 2022-11-08 DIAGNOSIS — R0609 Other forms of dyspnea: Secondary | ICD-10-CM | POA: Diagnosis not present

## 2022-11-08 DIAGNOSIS — E669 Obesity, unspecified: Secondary | ICD-10-CM

## 2022-11-08 DIAGNOSIS — G4733 Obstructive sleep apnea (adult) (pediatric): Secondary | ICD-10-CM | POA: Diagnosis not present

## 2022-11-08 DIAGNOSIS — G4730 Sleep apnea, unspecified: Secondary | ICD-10-CM

## 2022-11-08 NOTE — Progress Notes (Signed)
[facility]  Chief Complaint  Patient presents with   Follow-up    Follow up  had issues with breathing ,like  sob after he had covid  started about 2 years,  he has follow up cardiology , notice more when doing active     Past Surgical History:  He  has a past surgical history that includes Rotator cuff repair; Chest tube insertion (Left, 2017); Tooth extraction; Colonoscopy (10/2017); and Vasectomy.  Past Medical History:  HLD, Familial polyposis, HTN, BPH, PAF, Combined CHF  Constitutional:  BP 118/76   Pulse 62   Resp 13   Ht 6' (1.829 m)   Wt (!) 353 lb (160.1 kg)   SpO2 95%   BMI 47.88 kg/m   Brief Summary:  Clayton Freeman is a 67 y.o. male with obstructive sleep apnea.      Subjective:   Uses CPAP nightly.  Using a full face mask.  No issues with mask fit.  Has been exercising more.  Walks 12 to 14 thousands steps per day.  He feels like he has hit a plateau and gets short of breath even though he wants to try and do more.  He has notices his heart rate doesn't go above 115 when exercising.  He recovers quickly after resting.  He developed plantar fasciitis.  He will be getting orthotic shoe inserts.  He asked about [employer].  Physical Exam:   Appearance - well kempt   ENMT - no sinus tenderness, no oral exudate, no LAN, Mallampati 4 airway, no stridor  Respiratory - equal breath sounds bilaterally, no wheezing or rales  CV - s1s2 regular rate and rhythm, no murmurs  Ext - no clubbing, no edema  Skin - no rashes  Psych - normal mood and affect   Sleep Tests:  PSG 04/18/21 >> AHI 90.1 SpO2 low 71% ONO with CPAP 10/25/11 >> test time 5 hrs 50 min. Baseline SpO2 94%, low SpO2 90%.  Auto CPAP 10/09/22 to 11/07/22 >> used on 30 of 30 nights with average 7 hrs 5 min.  Average AHI 0.2 with median CPAP 10 and 95 th percentile CPAP 14 cm H2O  Cardiac Tests:  Echo 02/13/21 >> EF 45%, mild LVH, grade 2 DD, aortic root  41 mm  Social History:  He  reports that he has quit smoking. He quit smokeless tobacco use about 19 years ago.  His smokeless tobacco use included snuff. He reports that he does not currently use alcohol. He reports that he does not use drugs.  Family History:  His family history includes Colon polyps in his father; Colonic polyp in his father; Coronary artery disease in his paternal grandfather; Diverticulitis in his father; Hypertension in his father and mother; Prostate cancer in his father.     Assessment/Plan:   Obstructive sleep apnea. - he is compliant with CPAP and reports benefit from therapy - he uses [employer] for his DME - current CPAP ordered in March 2023 - continue auto CPAP 8 to 18 cm H2O - discussed pros/cons of hypoglossal nerve stimulators; he would need to have his BMI below 35  Dyspnea on exertion. - likely from obesity with deconditioning but may also have chronotropic insufficiency - he will discuss with cardiology at his next visit; if this is unremarkable, then he is to call and arrange for further pulmonary evalution  Paroxysmal atrial fibrillation, Chronic combined CHF - followed by Dr. Loman Brooklyn with [facility] cardiology  Obesity. -  discussed how weight can impact sleep and risk for sleep disordered breathing - discussed options to assist with weight loss: combination of diet modification, cardiovascular and strength training exercises  Time Spent Involved in Patient Care on Day of Examination:  28 minutes  Follow up:   Patient Instructions  Follow up in 1 year  Medication List:   Allergies as of 11/08/2022       Reactions   Bee Venom Anaphylaxis, Hives, Shortness Of Breath, Itching, Swelling, Palpitations, Rash   Penicillins Anaphylaxis, Hives, Shortness Of Breath, Itching, Swelling, Palpitations, Rash   Aspirin Other (See Comments)   Told to avoid due to taking Eliquis   Other Hives, Itching   Horse Radish - hives, itching   Erythromycin Hives,  Swelling, Rash   r        Medication List        Accurate as of November 08, 2022 10:10 AM. If you have any questions, ask your nurse or doctor.          STOP taking these medications    methylPREDNISolone 4 MG Tbpk tablet Commonly known as: MEDROL DOSEPAK Stopped by: Ishmeal Rorie   nystatin ointment Commonly known as: MYCOSTATIN Stopped by: Coralyn Helling       TAKE these medications    acetaminophen 500 MG tablet Commonly known as: TYLENOL Take 500-1,000 mg by mouth daily as needed for headache or mild pain.   amiodarone 200 MG tablet Commonly known as: PACERONE Take 1 tablet (200 mg total) by mouth daily.   apixaban 5 MG Tabs tablet Commonly known as: ELIQUIS Take 1 tablet (5 mg total) by mouth 2 (two) times daily.   clotrimazole-betamethasone cream Commonly known as: Lotrisone Use twice daily for 7-10 days   EPINEPHrine 0.3 mg/0.3 mL Soaj injection Commonly known as: EPI-PEN Inject 0.3 mg into the muscle once as needed (anaphylaxis/allergic reaction).   Fish Oil 1000 MG Caps Take 1,000 mg by mouth daily at 6 (six) AM.   ketoconazole 2 % cream Commonly known as: NIZORAL Apply 1 Application topically 2 (two) times daily.   METAMUCIL FIBER PO Take 2 Scoops by mouth 2 (two) times daily.   metoprolol succinate 100 MG 24 hr tablet Commonly known as: TOPROL-XL Take 1 tablet (100 mg total) by mouth 2 (two) times daily.   multivitamin tablet Take 1 tablet by mouth daily.   PATADAY OP Place 1 drop into both eyes daily as needed (itchy eyes).   tadalafil 5 MG tablet Commonly known as: CIALIS Take 5 mg by mouth daily as needed for erectile dysfunction.   tamsulosin 0.4 MG Caps capsule Commonly known as: FLOMAX Take 0.4 mg by mouth every evening.   VITAMIN E PO Take 1 capsule by mouth daily.        Signature:  Coralyn Helling, MD [facility] Pager - [phone] 11/08/2022, 10:10 AM

## 2022-11-08 NOTE — Patient Instructions (Signed)
Follow up in 1 year.

## 2022-11-14 ENCOUNTER — Ambulatory Visit: Payer: BC Managed Care – PPO

## 2022-11-14 DIAGNOSIS — M722 Plantar fascial fibromatosis: Secondary | ICD-10-CM

## 2022-11-14 NOTE — Progress Notes (Signed)
Patient presents today to pick up custom molded foot orthotics, diagnosed with RT plantar fasciitis by Dr. Logan Bores.   Orthotics were dispensed and fit was satisfactory. Reviewed instructions for break-in and wear. Written instructions given to patient.  Patient will follow up as needed.  Addison Bailey CPed, CFo, CFm

## 2022-11-27 DIAGNOSIS — G4733 Obstructive sleep apnea (adult) (pediatric): Secondary | ICD-10-CM | POA: Diagnosis not present

## 2022-12-12 ENCOUNTER — Ambulatory Visit: Payer: Self-pay

## 2022-12-12 ENCOUNTER — Ambulatory Visit (INDEPENDENT_AMBULATORY_CARE_PROVIDER_SITE_OTHER): Payer: BC Managed Care – PPO

## 2022-12-12 ENCOUNTER — Ambulatory Visit: Payer: BC Managed Care – PPO | Admitting: Family Medicine

## 2022-12-12 ENCOUNTER — Encounter: Payer: Self-pay | Admitting: Family Medicine

## 2022-12-12 VITALS — BP 138/80 | HR 74 | Ht 72.0 in | Wt 356.0 lb

## 2022-12-12 DIAGNOSIS — M25562 Pain in left knee: Secondary | ICD-10-CM | POA: Diagnosis not present

## 2022-12-12 NOTE — Patient Instructions (Signed)
Thank you for coming in today.  You received an injection today. Seek immediate medical attention if the joint becomes red, extremely painful, or is oozing fluid.  Please get an Xray today before you leave  Voltaren Gel  Continue with bracing as needed

## 2022-12-12 NOTE — Progress Notes (Signed)
   I, Stevenson Clinch, CMA acting as a scribe for Clayton Graham, MD.  Clayton Freeman is a 67 y.o. male who presents to [facility] today for L knee pain x 2 weeks. Pt locates pain to medial aspect. Feels that knee sx are related to abn gait with R PF. Also notes hyperextending the knee x 3, feeling like it will buckle. Sx causing night disturbance over the past week.   L Knee swelling: yes Mechanical symptoms: yes Aggravates: WB, ambulation Treatments tried: bracing,   Pertinent review of systems: No fevers or chills  Relevant historical information: A-fib.  Obesity.   Exam:  BP 138/80   Pulse 74   Ht 6' (1.829 m)   Wt (!) 356 lb (161.5 kg)   SpO2 96%   BMI 48.28 kg/m  General: Well Developed, well nourished, and in no acute distress.   MSK: Left knee mild effusion normal-appearing otherwise. Normal knee motion. Tender palpation medial joint line. Stable ligamentous exam. Intact strength. Positive McMurray's test.    Lab and Radiology Results   Procedure: Real-time Ultrasound Guided Injection of left knee joint superior lateral patella space Device: Philips Affiniti 50G/GE Logiq Images permanently stored and available for review in PACS Verbal informed consent obtained.  Discussed risks and benefits of procedure. Warned about infection, bleeding, hyperglycemia damage to structures among others. Patient expresses understanding and agreement Time-out conducted.   Noted no overlying erythema, induration, or other signs of local infection.   Skin prepped in a sterile fashion.   Local anesthesia: Topical Ethyl chloride.   With sterile technique and under real time ultrasound guidance: 40 mg of Kenalog and 2 mL of Marcaine injected into knee joint. Fluid seen entering the joint capsule.   Completed without difficulty   Pain immediately resolved suggesting accurate placement of the medication.   Advised to call if fevers/chills, erythema,  induration, drainage, or persistent bleeding.   Images permanently stored and available for review in the ultrasound unit.  Impression: Technically successful ultrasound guided injection.   X-ray images left knee obtained today personally and independently interpreted No acute fractures.  Trace degenerative changes. Await formal radiology review    Assessment and Plan: 67 y.o. male with left knee pain concerning for degenerative meniscus tear or exacerbation of DJD.  Plan for steroid injection today.  Recheck back as needed.   PDMP not reviewed this encounter. Orders Placed This Encounter  Procedures   [place] LIMITED JOINT SPACE STRUCTURES LOW LEFT(NO LINKED CHARGES)    Order Specific Question:   Reason for Exam (SYMPTOM  OR DIAGNOSIS REQUIRED)    Answer:   left knee pain    Order Specific Question:   Preferred imaging location?    Answer:   [facility] Knee AP/LAT W/Sunrise Left    Standing Status:   Future    Number of Occurrences:   1    Standing Expiration Date:   01/12/2023    Order Specific Question:   Reason for Exam (SYMPTOM  OR DIAGNOSIS REQUIRED)    Answer:   left knee pain    Order Specific Question:   Preferred imaging location?    Answer:   [facility]   No orders of the defined types were placed in this encounter.    Discussed warning signs or symptoms. Please see discharge instructions. Patient expresses understanding.   The above documentation has been reviewed and is accurate and complete Clayton Freeman, M.D.

## 2022-12-23 NOTE — Progress Notes (Signed)
Left knee x-ray looks okay to radiology

## 2022-12-28 DIAGNOSIS — G4733 Obstructive sleep apnea (adult) (pediatric): Secondary | ICD-10-CM | POA: Diagnosis not present

## 2022-12-30 ENCOUNTER — Encounter: Payer: Self-pay | Admitting: Cardiology

## 2022-12-30 ENCOUNTER — Encounter: Payer: Self-pay | Admitting: Family Medicine

## 2022-12-30 ENCOUNTER — Ambulatory Visit: Payer: BC Managed Care – PPO | Admitting: Family Medicine

## 2022-12-30 ENCOUNTER — Ambulatory Visit: Payer: BC Managed Care – PPO | Attending: Cardiology | Admitting: Cardiology

## 2022-12-30 VITALS — BP 130/84 | HR 61 | Ht 72.0 in | Wt 357.4 lb

## 2022-12-30 VITALS — BP 124/78 | HR 60 | Temp 97.8°F | Wt 357.4 lb

## 2022-12-30 DIAGNOSIS — J029 Acute pharyngitis, unspecified: Secondary | ICD-10-CM | POA: Diagnosis not present

## 2022-12-30 DIAGNOSIS — R051 Acute cough: Secondary | ICD-10-CM

## 2022-12-30 DIAGNOSIS — I4819 Other persistent atrial fibrillation: Secondary | ICD-10-CM | POA: Diagnosis not present

## 2022-12-30 DIAGNOSIS — I472 Ventricular tachycardia, unspecified: Secondary | ICD-10-CM | POA: Diagnosis not present

## 2022-12-30 DIAGNOSIS — Z79899 Other long term (current) drug therapy: Secondary | ICD-10-CM

## 2022-12-30 DIAGNOSIS — R0602 Shortness of breath: Secondary | ICD-10-CM | POA: Diagnosis not present

## 2022-12-30 DIAGNOSIS — I5022 Chronic systolic (congestive) heart failure: Secondary | ICD-10-CM | POA: Diagnosis not present

## 2022-12-30 DIAGNOSIS — I493 Ventricular premature depolarization: Secondary | ICD-10-CM | POA: Diagnosis not present

## 2022-12-30 DIAGNOSIS — D6869 Other thrombophilia: Secondary | ICD-10-CM

## 2022-12-30 LAB — POCT RAPID STREP A (OFFICE): Rapid Strep A Screen: NEGATIVE

## 2022-12-30 LAB — POC COVID19 BINAXNOW: SARS Coronavirus 2 Ag: NEGATIVE

## 2022-12-30 MED ORDER — AMIODARONE HCL 200 MG PO TABS: 100.0000 mg | ORAL_TABLET | Freq: Every day | ORAL | 2 refills | Status: DC

## 2022-12-30 MED ORDER — DOXYCYCLINE HYCLATE 100 MG PO TABS: 100.0000 mg | ORAL_TABLET | Freq: Two times a day (BID) | ORAL | 0 refills | Status: DC
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
## 2022-12-30 NOTE — Patient Instructions (Signed)
Medication Instructions:  Your physician has recommended you make the following change in your medication:  DECREASE Amiodarone to 100 mg daily  *If you need a refill on your cardiac medications before your next appointment, please call your pharmacy*   Lab Work: Today:  CMET, CBC, TSH & BNP If you have labs (blood work) drawn today and your tests are completely normal, you will receive your results only by: MyChart Message (if you have MyChart) OR A paper copy in the mail If you have any lab test that is abnormal or we need to change your treatment, we will call you to review the results.   Testing/Procedures: None ordered   Follow-Up: At [facility], you and your health needs are our priority.  As part of our continuing mission to provide you with exceptional heart care, we have created designated Provider Care Teams.  These Care Teams include your primary Cardiologist (physician) and Advanced Practice Providers (APPs -  Physician Assistants and Nurse Practitioners) who all work together to provide you with the care you need, when you need it.  Your next appointment:   6 month(s)  The format for your next appointment:   In Person  Provider:   You will see one of the following Advanced Practice Providers on your designated Care Team:   Francis Dowse, [place] "Mardelle Matte" [place], [place] Canary Brim, NP   Thank you for choosing [facility]!!   Dory Horn, RN [phone]

## 2022-12-30 NOTE — Patient Instructions (Signed)
Exam is reassuring today and COVID test was negative.  As we discussed that would be less likely COVID and last year had a false negative initially and now clearing that virus.  Strep test was also negative and I do not see signs on your exam with strep throat at this time.  Try Cepacol lozenges, other information below on sore throat care.  Additionally I do not suspect a bacterial sinus infection at this time but if you do have worsening sinus pain/pressure with discolored nasal discharge I did prescribe doxycycline to start if needed.  Thanks for coming in today and hope you feel better soon.  Sore Throat A sore throat is pain, burning, irritation, or scratchiness in the throat. When you have a sore throat, you may feel pain or tenderness in your throat when you swallow or talk. Many things can cause a sore throat, including: An infection. Seasonal allergies. Dryness in the air. Irritants, such as smoke or pollution. Radiation treatment for cancer. Gastroesophageal reflux disease (GERD). A tumor. A sore throat is often the first sign of another sickness. It may happen with other symptoms, such as coughing, sneezing, fever, and swollen neck glands. Most sore throats go away without medical treatment. Follow these instructions at home:     Medicines Take over-the-counter and prescription medicines only as told by your health care provider. Children often get sore throats. Do not give your child aspirin because of the association with Reye's syndrome. Use throat sprays to soothe your throat as told by your health care provider. Managing pain To help with pain, try: Sipping warm liquids, such as broth, herbal tea, or warm water. Eating or drinking cold or frozen liquids, such as frozen ice pops. Gargling with a mixture of salt and water 3-4 times a day or as needed. To make salt water, completely dissolve -1 tsp (3-6 g) of salt in 1 cup (237 mL) of warm water. Sucking on hard candy or  throat lozenges. Putting a cool-mist humidifier in your bedroom at night to moisten the air. Sitting in the bathroom with the door closed for 5-10 minutes while you run hot water in the shower. General instructions Do not use any products that contain nicotine or tobacco. These products include cigarettes, chewing tobacco, and vaping devices, such as e-cigarettes. If you need help quitting, ask your health care provider. Rest as needed. Drink enough fluid to keep your urine pale yellow. Wash your hands often with soap and water for at least 20 seconds. If soap and water are not available, use hand sanitizer. Contact a health care provider if: You have a fever for more than 2-3 days. You have symptoms that last for more than 2-3 days. Your throat does not get better within 7 days. You have a fever and your symptoms suddenly get worse. Get help right away if: You have difficulty breathing. You cannot swallow fluids, soft foods, or your saliva. You have increased swelling in your throat or neck. You have persistent nausea and vomiting. These symptoms may represent a serious problem that is an emergency. Do not wait to see if the symptoms will go away. Get medical help right away. Call your local emergency services (911 in the [place] Do not drive yourself to the hospital. Summary A sore throat is pain, burning, irritation, or scratchiness in the throat. Many things can cause a sore throat. Take over-the-counter medicines only as told by your health care provider. Rest as needed. Drink enough fluid to keep your urine  pale yellow. Contact a health care provider if your throat does not get better within 7 days. This information is not intended to replace advice given to you by your health care provider. Make sure you discuss any questions you have with your health care provider. Document Revised: 06/28/2020 Document Reviewed: 06/28/2020 Elsevier Patient Education  2024 [employer].

## 2022-12-30 NOTE — Progress Notes (Signed)
Electrophysiology Office Note:   Date:  12/30/2022  ID:  Clayton Freeman, DOB 02-Oct-1955, MRN [id number]  Primary Cardiologist: Lesleigh Noe, MD (Inactive) Electrophysiologist: Natalina Wieting Jorja Loa, MD    History of Present Illness:   Clayton Freeman is a 67 y.o. male with h/o atrial fibrillation, PVCs seen today for routine electrophysiology followup.   Since last being seen in our clinic the patient reports overall doing well.  He has noted no further PVCs or episodes of atrial fibrillation.  He does note an increase in his shortness of breath.  This is on an intermittent basis.  At times it happens when he is exerting himself, but it can also happen at rest.  He does wear compression socks, sometimes to bed at night and does note that his legs have been more swollen.  he denies chest pain, palpitations, dyspnea, PND, orthopnea, nausea, vomiting, dizziness, syncope, edema, weight gain, or early satiety.      He presented to the hospital in July 2022 with shortness of breath and cough and was found to have pneumonia. He was also found to have atrial fibrillation. Cardiac monitor showed a 35% PVC burden with an echo showing an ejection fraction of 45%. He was started on mexiletine. He continued to have more frequent episodes of atrial fibrillation and is now on amiodarone.        Review of systems complete and found to be negative unless listed in HPI.   EP Information / Studies Reviewed:    EKG is ordered today. Personal review as below.  EKG Interpretation Date/Time:  Monday December 30 2022 10:55:31 EDT Ventricular Rate:  61 PR Interval:  180 QRS Duration:  150 QT Interval:  484 QTC Calculation: 487 R Axis:   -51  Text Interpretation: Normal sinus rhythm Right bundle branch block Left anterior fascicular block Bifascicular block When compared with ECG of 22-Nov-2021 10:34, Premature ventricular complexes are no longer Present Confirmed by Arvada Seaborn ([id number]) on 12/30/2022  11:07:02 AM     Risk Assessment/Calculations:    CHA2DS2-VASc Score = 3   This indicates a 3.2% annual risk of stroke. The patient's score is based upon: CHF History: 1 HTN History: 1 Diabetes History: 0 Stroke History: 0 Vascular Disease History: 0 Age Score: 1 Gender Score: 0             Physical Exam:   VS:  BP 130/84 (BP Location: Left Arm, Patient Position: Sitting, Cuff Size: Large)   Pulse 61   Ht 6' (1.829 m)   Wt (!) 357 lb 6.4 oz (162.1 kg)   SpO2 95%   BMI 48.47 kg/m    Wt Readings from Last 3 Encounters:  12/30/22 (!) 357 lb 6.4 oz (162.1 kg)  12/12/22 (!) 356 lb (161.5 kg)  11/08/22 (!) 353 lb (160.1 kg)     GEN: Well nourished, well developed in no acute distress NECK: No JVD; No carotid bruits CARDIAC: Regular rate and rhythm, no murmurs, rubs, gallops RESPIRATORY:  Clear to auscultation without rales, wheezing or rhonchi  ABDOMEN: Soft, non-tender, non-distended EXTREMITIES:  No edema; No deformity   ASSESSMENT AND PLAN:    1.  Paroxysmal atrial fibrillation: Currently on amiodarone.  No episodes.  Continue with current management.  2.  Secondary hypercoagulable state: Currently on Eliquis for atrial fibrillation  3.  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  Currently on amiodarone with a reduced burden to 3%.  4.  Obstructive apnea: CPAP compliance encouraged  5.  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  Repeat echo with mildly reduced ejection fraction.  He is mildly short of breath today.  Michial Disney check a BNP.  May require Lasix.  6.  Morbid obesity: Lifestyle modification encouraged  7.  High risk medication monitoring: Currently on amiodarone.  Emorie Mcfate check TSH, BMP and LFTs today  Follow up with EP APP in 6 months  Signed, Jamiracle Avants Jorja Loa, MD

## 2022-12-31 ENCOUNTER — Other Ambulatory Visit: Payer: Self-pay | Admitting: Cardiology

## 2022-12-31 LAB — CBC
Hematocrit: 43.7 % (ref 37.5–51.0)
Hemoglobin: 14.9 g/dL (ref 13.0–17.7)
MCH: 33.2 pg — ABNORMAL HIGH (ref 26.6–33.0)
MCHC: 34.1 g/dL (ref 31.5–35.7)
MCV: 97 fL (ref 79–97)
Platelets: 168 10*3/uL (ref 150–450)
RBC: 4.49 x10E6/uL (ref 4.14–5.80)
RDW: 12.4 % (ref 11.6–15.4)
WBC: 7.4 10*3/uL (ref 3.4–10.8)

## 2022-12-31 LAB — COMPREHENSIVE METABOLIC PANEL
ALT: 25 IU/L (ref 0–44)
AST: 14 IU/L (ref 0–40)
Albumin: 4.3 g/dL (ref 3.9–4.9)
Alkaline Phosphatase: 71 IU/L (ref 44–121)
BUN/Creatinine Ratio: 12 (ref 10–24)
BUN: 10 mg/dL (ref 8–27)
Bilirubin Total: 0.4 mg/dL (ref 0.0–1.2)
CO2: 27 mmol/L (ref 20–29)
Calcium: 9.8 mg/dL (ref 8.6–10.2)
Chloride: 100 mmol/L (ref 96–106)
Creatinine, Ser: 0.84 mg/dL (ref 0.76–1.27)
Globulin, Total: 2.5 g/dL (ref 1.5–4.5)
Glucose: 117 mg/dL — ABNORMAL HIGH (ref 70–99)
Potassium: 4.5 mmol/L (ref 3.5–5.2)
Sodium: 138 mmol/L (ref 134–144)
Total Protein: 6.8 g/dL (ref 6.0–8.5)
eGFR: 96 mL/min/{1.73_m2} (ref >59)

## 2022-12-31 LAB — TSH: TSH: 2.25 u[IU]/mL (ref 0.450–4.500)

## 2022-12-31 LAB — PRO B NATRIURETIC PEPTIDE: NT-Pro BNP: 363 pg/mL (ref 0–376)

## 2023-01-06 ENCOUNTER — Encounter: Payer: Self-pay | Admitting: Cardiology

## 2023-01-16 ENCOUNTER — Other Ambulatory Visit: Payer: Self-pay | Admitting: Cardiology

## 2023-01-16 DIAGNOSIS — I4891 Unspecified atrial fibrillation: Secondary | ICD-10-CM

## 2023-01-17 NOTE — Telephone Encounter (Signed)
Prescription refill request for Eliquis received. Indication:afib Last office visit:9/24 Scr:0.84  9/24 Age: 67 Weight:162.1  kg  Prescription refilled

## 2023-01-27 DIAGNOSIS — G4733 Obstructive sleep apnea (adult) (pediatric): Secondary | ICD-10-CM | POA: Diagnosis not present

## 2023-03-31 ENCOUNTER — Encounter: Payer: Self-pay | Admitting: Family Medicine

## 2023-03-31 DIAGNOSIS — R059 Cough, unspecified: Secondary | ICD-10-CM

## 2023-03-31 NOTE — Telephone Encounter (Signed)
Recommend virtual visit if possible however if he is unable to do this we can send in a zpack to avoid delaying care.  Katina Degree. Jimmey Ralph, MD 03/31/2023 3:26 PM

## 2023-04-01 ENCOUNTER — Other Ambulatory Visit: Payer: Self-pay | Admitting: *Deleted

## 2023-04-01 NOTE — Telephone Encounter (Signed)
Unable to order Zpack due to allergic reaction Please advise Patient has an OV on 04/03/2023

## 2023-04-02 MED ORDER — DOXYCYCLINE HYCLATE 100 MG PO TABS: 100.0000 mg | ORAL_TABLET | Freq: Two times a day (BID) | ORAL | 0 refills | Status: DC

## 2023-04-03 ENCOUNTER — Telehealth: Payer: BC Managed Care – PPO | Admitting: Family Medicine

## 2023-04-03 VITALS — Ht 72.0 in

## 2023-04-03 DIAGNOSIS — J329 Chronic sinusitis, unspecified: Secondary | ICD-10-CM | POA: Diagnosis not present

## 2023-04-03 MED ORDER — AZELASTINE HCL 0.1 % NA SOLN: 2.0000 | Freq: Two times a day (BID) | NASAL | 12 refills | Status: AC

## 2023-04-03 MED ORDER — PROMETHAZINE-DM 6.25-15 MG/5ML PO SYRP: 5.0000 mL | ORAL_SOLUTION | Freq: Four times a day (QID) | ORAL | 0 refills | Status: DC | PRN

## 2023-04-03 NOTE — Progress Notes (Signed)
   Clayton Freeman is a 67 y.o. male who presents today for a virtual office visit.  Assessment/Plan:  Sinusitis  No red flags.  Has had ongoing symptoms for the last couple of weeks.  Started doxycycline yesterday.  He has a penicillin and macrolide allergy.  Discussed with patient it will probably take another day or 2 before antibiotics start to have a noticeable effect.  Will also start Astelin nasal spray and promethazine dextromethorphan cough syrup.  Encouraged hydration.  He can use other over-the-counter meds as needed as well.  He will let us know if not improving by next week.    Subjective:  HPI:  See Assessment / plan for status of chronic conditions.  Patient here today with concern for sinus infection.  Symptoms started almost 2 weeks ago.  Initially cold-like symptoms but have recently progressed to more like sinus infection.  Feels consistent with previous sinus infection.  He has tried using over-the-counter meds without much improvement.  He is having more thick green discharge.  We did send in a prescription for doxycycline which he started yesterday.  No fevers or chills.  Having a bit more of a cough recently.        Objective/Observations  Physical Exam: Gen: NAD, resting comfortably Pulm: Normal work of breathing Neuro: Grossly normal, moves all extremities Psych: Normal affect and thought content  Virtual Visit via Video   I connected with Clayton Freeman on 04/03/23 at 11:40 AM EST by a video enabled telemedicine application and verified that I am speaking with the correct person using two identifiers. The limitations of evaluation and management by telemedicine and the availability of in person appointments were discussed. The patient expressed understanding and agreed to proceed.   Patient location: Home Provider location: [place] [employer] Persons participating in the virtual visit: Myself and Patient     Katina Degree. Jimmey Ralph, MD 04/03/2023 12:42  PM

## 2023-04-07 DIAGNOSIS — G4733 Obstructive sleep apnea (adult) (pediatric): Secondary | ICD-10-CM | POA: Diagnosis not present

## 2023-04-15 DIAGNOSIS — R3915 Urgency of urination: Secondary | ICD-10-CM | POA: Diagnosis not present

## 2023-04-15 DIAGNOSIS — R350 Frequency of micturition: Secondary | ICD-10-CM | POA: Diagnosis not present

## 2023-04-15 DIAGNOSIS — R3912 Poor urinary stream: Secondary | ICD-10-CM | POA: Diagnosis not present

## 2023-04-15 DIAGNOSIS — N401 Enlarged prostate with lower urinary tract symptoms: Secondary | ICD-10-CM | POA: Diagnosis not present

## 2023-07-12 DIAGNOSIS — G4733 Obstructive sleep apnea (adult) (pediatric): Secondary | ICD-10-CM | POA: Diagnosis not present

## 2023-07-13 ENCOUNTER — Other Ambulatory Visit: Payer: Self-pay | Admitting: Cardiology

## 2023-07-13 DIAGNOSIS — I4891 Unspecified atrial fibrillation: Secondary | ICD-10-CM

## 2023-07-14 NOTE — Telephone Encounter (Signed)
Prescription refill request for Eliquis received. Indication:afib Last office visit:9/24 Scr:0.84  9/24 Age: 68 Weight:162.1  kg  Prescription refilled

## 2023-08-21 ENCOUNTER — Encounter: Payer: Self-pay | Admitting: Family Medicine

## 2023-08-21 NOTE — Telephone Encounter (Signed)
**Note De-identified  [place] Obfuscation** Please advise 

## 2023-08-22 ENCOUNTER — Other Ambulatory Visit: Payer: Self-pay | Admitting: *Deleted

## 2023-08-22 MED ORDER — KETOCONAZOLE 2 % EX CREA: 1.0000 | TOPICAL_CREAM | Freq: Two times a day (BID) | CUTANEOUS | 5 refills | Status: AC

## 2023-08-22 NOTE — Telephone Encounter (Signed)
 Okay to refill.  Needs appointment if not improving.

## 2023-09-11 DIAGNOSIS — G4733 Obstructive sleep apnea (adult) (pediatric): Secondary | ICD-10-CM | POA: Diagnosis not present

## 2023-09-23 ENCOUNTER — Other Ambulatory Visit: Payer: Self-pay | Admitting: Cardiology
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
## 2023-10-27 ENCOUNTER — Encounter: Payer: Self-pay | Admitting: Family Medicine

## 2023-10-27 ENCOUNTER — Other Ambulatory Visit: Payer: Self-pay

## 2023-10-27 ENCOUNTER — Ambulatory Visit (INDEPENDENT_AMBULATORY_CARE_PROVIDER_SITE_OTHER): Admitting: Family Medicine

## 2023-10-27 VITALS — BP 122/70 | HR 74 | Ht 72.0 in | Wt 359.0 lb

## 2023-10-27 DIAGNOSIS — M1711 Unilateral primary osteoarthritis, right knee: Secondary | ICD-10-CM

## 2023-10-27 DIAGNOSIS — M2550 Pain in unspecified joint: Secondary | ICD-10-CM | POA: Diagnosis not present

## 2023-10-27 DIAGNOSIS — M25462 Effusion, left knee: Secondary | ICD-10-CM

## 2023-10-27 DIAGNOSIS — M25561 Pain in right knee: Secondary | ICD-10-CM | POA: Diagnosis not present

## 2023-10-27 DIAGNOSIS — M25562 Pain in left knee: Secondary | ICD-10-CM

## 2023-10-27 LAB — SYNOVIAL FLUID ANALYSIS, COMPLETE
Basophils, %: 0 %
Eosinophils-Synovial: 0 % (ref 0–2)
Lymphocytes-Synovial Fld: 62 % (ref 0–74)
Monocyte/Macrophage: 37 % (ref 0–69)
Neutrophil, Synovial: 1 % (ref 0–24)
Synoviocytes, %: 0 % (ref 0–15)
WBC, Synovial: 373 {cells}/uL — ABNORMAL HIGH (ref <150)

## 2023-10-27 NOTE — Assessment & Plan Note (Signed)
 Patient given injection and tolerated the procedure well.  We did not aspirate 60 cc of straw-colored fluid.  Hopefully patient will respond to this.  Could be a candidate for viscosupplementation.  X-rays have shown some arthritic changes previously.

## 2023-10-27 NOTE — Patient Instructions (Addendum)
 Referral to PT Aspirated left knee. 2:1 in right knee today. Do prescribed exercises at least 3x a week Voltaren and ice See you again in 2 months

## 2023-10-27 NOTE — Assessment & Plan Note (Signed)
 Given injection today.  Tolerated the procedure well.  Hopeful that this will make a significant difference.  1 patient to continue to work on his weight loss.  Unfortunately continues to have a BMI over 40.  He is starting to walk with him now being retired.  Hopeful that this will be beneficial.  We did discuss the low impact exercises.  Could be a good candidate for viscosupplementation.

## 2023-10-28 ENCOUNTER — Encounter: Payer: Self-pay | Admitting: Family Medicine

## 2023-10-28 ENCOUNTER — Telehealth: Payer: Self-pay

## 2023-10-28 NOTE — Telephone Encounter (Signed)
 Patient has been ran for Synvisc bilateral knees via fax on 10/28/23. Pending approval.

## 2023-10-28 NOTE — Telephone Encounter (Signed)
-----   Message from Joesph JONELLE Gully sent at 10/28/2023  8:44 AM EDT ----- Regarding: GEl approval B knee approval for gel please

## 2023-10-29 NOTE — Telephone Encounter (Signed)
 Ran patient for Monovisc for bilateral knees. Case number: [id number]. Pending approval.  Synvisc was faxed back to office multiple times without any information provided from [facility]. After researching, found out that patient has Medicare A & B with a BCBS supplement plan. Primary Coverage is no longer BCBS Comm PPO.

## 2023-10-31 ENCOUNTER — Encounter: Payer: Self-pay | Admitting: Family Medicine

## 2023-10-31 NOTE — Telephone Encounter (Signed)
 Patient needs an appointment once medication is in stock.   Monovisc approved for bilateral knees. Primary:Deductible applies. Once the deductible has been met, patient is responsible for a coinsurance. Prior authorization is not required. Secondary: The secondary plan follows Medicare guidelines. It will pick up remaining eligible expenses at 100%. It does not cover The Medicare Part B deductible.   Case #: [id number] Exp: 04/30/2024

## 2023-11-03 NOTE — Telephone Encounter (Signed)
Patient is scheduled for 9/16.

## 2023-11-06 NOTE — Telephone Encounter (Unsigned)
 Copied from CRM [id number]. Topic: General - Other >> Nov 05, 2023  6:00 PM Paige D wrote: Reason for CRM:  [facility] calling in regards to Pt Cpap [facility] has sent over a fax in regards to this prescription.

## 2023-11-12 NOTE — Progress Notes (Unsigned)
 11/13/23- 80 yoM former smoker previously seen by Dr Shellia 06/2021 and coming now for f/u OSA NPSG 04/18/21- AHI 90.1/hr, desat to 75%, body weight 340 lbs CPAP auto 8-18/ [facility]   replaced 06/2021 Body weight today- Medical problems include PAFib, HTN, CHF, morbid obesity Past Medical History:  Diagnosis Date   Arthritis    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 failure) (HCC)    Dyslipidemia    FAP (familial adenomatous polyposis)    Hypertension    Morbid obesity with BMI of 45.0-49.9, adult (HCC)    OSA (obstructive sleep apnea)    CPAP   Paroxysmal atrial fibrillation (HCC)    Sleep apnea

## 2023-11-13 ENCOUNTER — Ambulatory Visit: Admitting: Internal Medicine

## 2023-11-13 ENCOUNTER — Encounter: Payer: Self-pay | Admitting: Internal Medicine

## 2023-11-13 VITALS — BP 131/63 | HR 65 | Temp 98.1°F | Ht 72.0 in | Wt 356.8 lb

## 2023-11-13 DIAGNOSIS — G4733 Obstructive sleep apnea (adult) (pediatric): Secondary | ICD-10-CM | POA: Diagnosis not present

## 2023-11-13 NOTE — Patient Instructions (Signed)
  Glad you are doing well with your CPAP-we can continue auto 8-18.   Please call if we can help

## 2023-11-21 ENCOUNTER — Encounter: Payer: Self-pay | Admitting: Family Medicine

## 2023-11-21 ENCOUNTER — Telehealth: Payer: Self-pay | Admitting: Family Medicine

## 2023-11-21 NOTE — Telephone Encounter (Signed)
 Pt left knee is very painful and swollen again. Last seen in July with a plan for possible gel in the future?  Pt wanted to get in asap and possible get the gel. Not sure if the timing is right for this, if the L knee needs drained first or how I should proceed for scheduling.

## 2023-11-26 ENCOUNTER — Telehealth: Payer: Self-pay | Admitting: Family Medicine

## 2023-11-26 NOTE — Telephone Encounter (Signed)
 Patient was on the cancellation list and is rescheduled for 12/03/2023. Please confirm that patient is okay to receive injections on this date or if it is too early. He stated that we were working on getting approval for a different kind of injection so if it is monovisc just confirming this date will work.
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
## 2023-12-03 ENCOUNTER — Ambulatory Visit (INDEPENDENT_AMBULATORY_CARE_PROVIDER_SITE_OTHER): Admitting: Family Medicine

## 2023-12-03 ENCOUNTER — Encounter: Payer: Self-pay | Admitting: Family Medicine

## 2023-12-03 VITALS — BP 118/90 | HR 66 | Ht 72.0 in | Wt 356.0 lb

## 2023-12-03 DIAGNOSIS — G8929 Other chronic pain: Secondary | ICD-10-CM

## 2023-12-03 DIAGNOSIS — M25562 Pain in left knee: Secondary | ICD-10-CM

## 2023-12-03 DIAGNOSIS — M1711 Unilateral primary osteoarthritis, right knee: Secondary | ICD-10-CM | POA: Diagnosis not present

## 2023-12-03 DIAGNOSIS — M25462 Effusion, left knee: Secondary | ICD-10-CM

## 2023-12-03 DIAGNOSIS — M25461 Effusion, right knee: Secondary | ICD-10-CM

## 2023-12-03 MED ORDER — HYALURONAN 88 MG/4ML IX SOSY
88.0000 mg | PREFILLED_SYRINGE | Freq: Once | INTRA_ARTICULAR | Status: AC
  Administered 2023-12-05: 88 mg via INTRA_ARTICULAR

## 2023-12-03 NOTE — Assessment & Plan Note (Signed)
 Patient given injection and tolerated the procedure well, discussed icing regimen and home exercises, discussed which activities to do and which ones to avoid.  Increase activity slowly.  Discussed the potential feeling after the viscosupplementation.  Patient will come back again in 2 months and we will see how patient is responding.  Encourage patient to continue to try to lose weight which patient is trying.

## 2023-12-03 NOTE — Patient Instructions (Addendum)
 MRI L knee Monovisc for R knee We will be in touch

## 2023-12-03 NOTE — Assessment & Plan Note (Signed)
 Patient does have a aspiration previously and now unfortunately recurrent swelling noted.  Discussed icing regimen and home exercises, discussed which activities to do and which ones to avoid.  Because of the instability of the knee and the decrease in range of motion and patient's x-rays only showing mild arthritic changes I do feel that advanced imaging is warranted.  MRI of the left knee ordered today to further evaluate to see if there is any pathology that an arthroscopic procedure could help.  If arthritic changes are too severe then would have to go back to the viscosupplementation

## 2023-12-04 ENCOUNTER — Telehealth: Payer: Self-pay | Admitting: Family Medicine

## 2023-12-04 NOTE — Telephone Encounter (Signed)
 Patient called stating that Dr Claudene mentioned sending in a prescription for him to help with pain but his pharmacy has not received anything.  Please advise.

## 2023-12-05 ENCOUNTER — Other Ambulatory Visit: Payer: Self-pay

## 2023-12-05 DIAGNOSIS — M1711 Unilateral primary osteoarthritis, right knee: Secondary | ICD-10-CM

## 2023-12-05 MED ORDER — HYALURONAN 88 MG/4ML IX SOSY
88.0000 mg | PREFILLED_SYRINGE | Freq: Once | INTRA_ARTICULAR | Status: AC
  Administered 2023-12-03: 88 mg via INTRA_ARTICULAR

## 2023-12-07 ENCOUNTER — Ambulatory Visit (INDEPENDENT_AMBULATORY_CARE_PROVIDER_SITE_OTHER)

## 2023-12-07 DIAGNOSIS — M25562 Pain in left knee: Secondary | ICD-10-CM

## 2023-12-07 DIAGNOSIS — G8929 Other chronic pain: Secondary | ICD-10-CM

## 2023-12-08 ENCOUNTER — Ambulatory Visit: Payer: Self-pay | Admitting: Family Medicine

## 2023-12-08 ENCOUNTER — Other Ambulatory Visit: Payer: Self-pay | Admitting: Family Medicine

## 2023-12-08 DIAGNOSIS — G8929 Other chronic pain: Secondary | ICD-10-CM

## 2023-12-08 NOTE — Telephone Encounter (Signed)
 Referral placed.

## 2023-12-09 ENCOUNTER — Telehealth: Payer: Self-pay | Admitting: Family Medicine

## 2023-12-09 NOTE — Telephone Encounter (Signed)
 Patient called in regards to Dr. Claudene suggesting he needs surgery. Patient would like to have the information of where he is being sent for that and to call so that he can schedule an appointment to where he is being referred. He also called to follow up on some questions that he sent via mychart. Please advise.

## 2023-12-10 ENCOUNTER — Ambulatory Visit (HOSPITAL_BASED_OUTPATIENT_CLINIC_OR_DEPARTMENT_OTHER): Admitting: Physical Therapy

## 2023-12-18 ENCOUNTER — Encounter: Payer: Self-pay | Admitting: Cardiology

## 2023-12-19 MED ORDER — HYALURONAN 88 MG/4ML IX SOSY
88.0000 mg | PREFILLED_SYRINGE | Freq: Once | INTRA_ARTICULAR | Status: AC
Start: 2023-12-19 — End: 2023-12-19
  Administered 2023-12-19: 88 mg via INTRA_ARTICULAR

## 2023-12-19 NOTE — Addendum Note (Signed)
 Addended by: GALA BERWYN CROME on: 12/19/2023 09:13 AM   Modules accepted: Orders

## 2023-12-21 ENCOUNTER — Other Ambulatory Visit: Payer: Self-pay | Admitting: Cardiology

## 2023-12-22 ENCOUNTER — Encounter: Payer: Self-pay | Admitting: Family Medicine

## 2023-12-24 NOTE — Progress Notes (Unsigned)
  Electrophysiology Office Note:   Date:  12/25/2023  ID:  Clayton Freeman, DOB 1956-03-15, MRN [id number]  Primary Cardiologist: Victory LELON Claudene DOUGLAS, MD (Inactive) Electrophysiologist: Will Gladis Norton, MD   Electrophysiologist:  Soyla Gladis Norton, MD      History of Present Illness:   Clayton Freeman is a 68 y.o. male with h/o chronic systolic CHF, PVCs, and paroxysmal AF seen today for routine electrophysiology followup.   Since last being seen in our clinic the patient reports doing well overall. Currently, he denies chest pain, palpitations, PND, orthopnea, nausea, vomiting, dizziness, syncope, edema, weight gain, or early satiety.  Notes his HRs only get as high as 100-110 with exercise.  Review of systems complete and found to be negative unless listed in HPI.   EP Information / Studies Reviewed:    EKG is ordered today. Personal review as below.  EKG Interpretation Date/Time:  Thursday December 25 2023 12:21:49 EDT Ventricular Rate:  66 PR Interval:  168 QRS Duration:  146 QT Interval:  464 QTC Calculation: 486 R Axis:   -66  Text Interpretation: Sinus rhythm with Premature atrial complexes Right bundle branch block Left anterior fascicular block Bifascicular block When compared with ECG of 30-Dec-2022 10:55, Premature atrial complexes are now Present Confirmed by Lesia Sharper [phone]) on 12/25/2023 12:30:21 PM    Arrhythmia/Device History No specialty comments available.   Physical Exam:   VS:  BP 124/62   Pulse 66   Ht 6' (1.829 m)   Wt (!) 355 lb 9.6 oz (161.3 kg)   SpO2 96%   BMI 48.23 kg/m    Wt Readings from Last 3 Encounters:  12/25/23 (!) 355 lb 9.6 oz (161.3 kg)  12/03/23 (!) 356 lb (161.5 kg)  11/13/23 (!) 356 lb 12.8 oz (161.8 kg)     GEN: No acute distress NECK: No JVD; No carotid bruits CARDIAC: Regular rate and rhythm, no murmurs, rubs, gallops RESPIRATORY:  Clear to auscultation without rales, wheezing or rhonchi  ABDOMEN: Soft,  non-tender, non-distended EXTREMITIES:  No edema; No deformity   ASSESSMENT AND PLAN:    Paroxysmal AF EKG today shows NSR Continue eliquis  5 mg BID for CHA2DS2/VASc of at least 3 Continue amiodarone  100 mg daily. Surveillance labs today.   Encouraged weight loss to consider ablation, which would also help with deconditioning and arthritis.   Secondary hypercoagulable state Pt on Eliquis  as above    PVCs Burden significantly improved with amiodarone   OSA  Encouraged nightly CPAP   Chronic combined CHF Echo 07/2022 LVEF 45-50%  Obesity Body mass index is 48.23 kg/m.  Encouraged lifestyle modification  Follow up with Dr. Norton in 6 months  Signed, Sharper Prentice Lesia, PA-C

## 2023-12-25 ENCOUNTER — Encounter: Payer: Self-pay | Admitting: Student

## 2023-12-25 ENCOUNTER — Ambulatory Visit: Attending: Student | Admitting: Student

## 2023-12-25 VITALS — BP 124/62 | HR 66 | Ht 72.0 in | Wt 355.6 lb

## 2023-12-25 DIAGNOSIS — I4891 Unspecified atrial fibrillation: Secondary | ICD-10-CM | POA: Insufficient documentation

## 2023-12-25 DIAGNOSIS — I493 Ventricular premature depolarization: Secondary | ICD-10-CM | POA: Diagnosis present

## 2023-12-25 DIAGNOSIS — D6869 Other thrombophilia: Secondary | ICD-10-CM | POA: Insufficient documentation

## 2023-12-25 DIAGNOSIS — I5022 Chronic systolic (congestive) heart failure: Secondary | ICD-10-CM | POA: Diagnosis present

## 2023-12-25 DIAGNOSIS — G4733 Obstructive sleep apnea (adult) (pediatric): Secondary | ICD-10-CM | POA: Diagnosis present

## 2023-12-25 MED ORDER — METOPROLOL SUCCINATE ER 100 MG PO TB24: 100.0000 mg | ORAL_TABLET | Freq: Two times a day (BID) | ORAL | 3 refills | Status: AC

## 2023-12-25 NOTE — Patient Instructions (Signed)
 Medication Instructions:  Your physician recommends that you continue on your current medications as directed. Please refer to the Current Medication list given to you today.  *If you need a refill on your cardiac medications before your next appointment, please call your pharmacy*  Lab Work: CMET, TSH, FreeT4-TODAY If you have labs (blood work) drawn today and your tests are completely normal, you will receive your results only by: MyChart Message (if you have MyChart) OR A paper copy in the mail If you have any lab test that is abnormal or we need to change your treatment, we will call you to review the results.  Follow-Up: At [facility], you and your health needs are our priority.  As part of our continuing mission to provide you with exceptional heart care, our providers are all part of one team.  This team includes your primary Cardiologist (physician) and Advanced Practice Providers or APPs (Physician Assistants and Nurse Practitioners) who all work together to provide you with the care you need, when you need it.  Your next appointment:   6 month(s)  Provider:   Soyla Norton, MD

## 2023-12-26 ENCOUNTER — Ambulatory Visit: Payer: Self-pay | Admitting: Student

## 2023-12-26 LAB — COMPREHENSIVE METABOLIC PANEL WITH GFR
ALT: 25 IU/L (ref 0–44)
AST: 18 IU/L (ref 0–40)
Albumin: 4.3 g/dL (ref 3.9–4.9)
Alkaline Phosphatase: 68 IU/L (ref 44–121)
BUN/Creatinine Ratio: 12 (ref 10–24)
BUN: 9 mg/dL (ref 8–27)
Bilirubin Total: 0.6 mg/dL (ref 0.0–1.2)
CO2: 24 mmol/L (ref 20–29)
Calcium: 9.5 mg/dL (ref 8.6–10.2)
Chloride: 99 mmol/L (ref 96–106)
Creatinine, Ser: 0.73 mg/dL — ABNORMAL LOW (ref 0.76–1.27)
Globulin, Total: 2.4 g/dL (ref 1.5–4.5)
Glucose: 103 mg/dL — ABNORMAL HIGH (ref 70–99)
Potassium: 4.7 mmol/L (ref 3.5–5.2)
Sodium: 137 mmol/L (ref 134–144)
Total Protein: 6.7 g/dL (ref 6.0–8.5)
eGFR: 100 mL/min/1.73 (ref >59)

## 2023-12-26 LAB — TSH: TSH: 1.76 u[IU]/mL (ref 0.450–4.500)

## 2023-12-26 LAB — T4, FREE: Free T4: 1.5 ng/dL (ref 0.82–1.77)

## 2023-12-30 ENCOUNTER — Ambulatory Visit: Admitting: Family Medicine

## 2023-12-31 NOTE — Telephone Encounter (Signed)
 Patient scheduled for tomorrow

## 2024-01-01 ENCOUNTER — Ambulatory Visit: Admitting: Family Medicine

## 2024-01-01 ENCOUNTER — Encounter: Payer: Self-pay | Admitting: Family Medicine

## 2024-01-01 VITALS — BP 128/82 | HR 72 | Ht 72.0 in | Wt 353.0 lb

## 2024-01-01 DIAGNOSIS — M1712 Unilateral primary osteoarthritis, left knee: Secondary | ICD-10-CM | POA: Diagnosis not present

## 2024-01-01 MED ORDER — HYALURONAN 88 MG/4ML IX SOSY
88.0000 mg | PREFILLED_SYRINGE | Freq: Once | INTRA_ARTICULAR | Status: AC
  Administered 2024-01-01: 88 mg via INTRA_ARTICULAR
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
## 2024-01-01 NOTE — Patient Instructions (Signed)
 Monovisc for L knee See me again in 10-12 weeks

## 2024-01-01 NOTE — Assessment & Plan Note (Signed)
 Chronic, with worsening symptoms.  Discussed with patient at great length.  Moderate to severe arthritic changes noted and after seeing orthopedic discussed the possibility of knee replacement but due to patient's body habitus would have to change and lose weight initially.  We discussed with patient about other treatment and we decided to try the viscosupplementation.  Will see how patient responds.  Follow-up with me again 10 to 12 weeks for further evaluation.

## 2024-01-03 ENCOUNTER — Other Ambulatory Visit: Payer: Self-pay

## 2024-01-03 ENCOUNTER — Encounter (HOSPITAL_BASED_OUTPATIENT_CLINIC_OR_DEPARTMENT_OTHER): Payer: Self-pay | Admitting: Physical Therapy

## 2024-01-03 ENCOUNTER — Ambulatory Visit (HOSPITAL_BASED_OUTPATIENT_CLINIC_OR_DEPARTMENT_OTHER): Payer: Self-pay | Attending: Family Medicine | Admitting: Physical Therapy

## 2024-01-03 DIAGNOSIS — M6281 Muscle weakness (generalized): Secondary | ICD-10-CM | POA: Insufficient documentation

## 2024-01-03 DIAGNOSIS — R2689 Other abnormalities of gait and mobility: Secondary | ICD-10-CM | POA: Diagnosis present

## 2024-01-03 DIAGNOSIS — M25561 Pain in right knee: Secondary | ICD-10-CM | POA: Insufficient documentation

## 2024-01-03 DIAGNOSIS — R29898 Other symptoms and signs involving the musculoskeletal system: Secondary | ICD-10-CM | POA: Insufficient documentation

## 2024-01-03 DIAGNOSIS — M25562 Pain in left knee: Secondary | ICD-10-CM | POA: Insufficient documentation
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
## 2024-01-05 ENCOUNTER — Ambulatory Visit (HOSPITAL_BASED_OUTPATIENT_CLINIC_OR_DEPARTMENT_OTHER): Admitting: Physical Therapy

## 2024-01-05 ENCOUNTER — Encounter (HOSPITAL_BASED_OUTPATIENT_CLINIC_OR_DEPARTMENT_OTHER): Payer: Self-pay | Admitting: Physical Therapy

## 2024-01-05 DIAGNOSIS — R29898 Other symptoms and signs involving the musculoskeletal system: Secondary | ICD-10-CM

## 2024-01-05 DIAGNOSIS — M25562 Pain in left knee: Secondary | ICD-10-CM | POA: Diagnosis not present

## 2024-01-05 DIAGNOSIS — R2689 Other abnormalities of gait and mobility: Secondary | ICD-10-CM

## 2024-01-05 DIAGNOSIS — M6281 Muscle weakness (generalized): Secondary | ICD-10-CM
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
## 2024-01-08 ENCOUNTER — Other Ambulatory Visit: Payer: Self-pay | Admitting: Cardiology

## 2024-01-08 DIAGNOSIS — I4891 Unspecified atrial fibrillation: Secondary | ICD-10-CM

## 2024-01-08 NOTE — Telephone Encounter (Signed)
 Prescription refill request for Eliquis  received. Indication:afib Last office visit:9/25 Scr:0.73  9/25 Age: 68 Weight:160.1  kg  Prescription refilled

## 2024-01-17 ENCOUNTER — Encounter (HOSPITAL_BASED_OUTPATIENT_CLINIC_OR_DEPARTMENT_OTHER): Payer: Self-pay | Admitting: Rehabilitative and Restorative Service Providers"

## 2024-01-17 ENCOUNTER — Ambulatory Visit (HOSPITAL_BASED_OUTPATIENT_CLINIC_OR_DEPARTMENT_OTHER): Attending: Family Medicine | Admitting: Rehabilitative and Restorative Service Providers"

## 2024-01-17 DIAGNOSIS — M25562 Pain in left knee: Secondary | ICD-10-CM | POA: Insufficient documentation

## 2024-01-17 DIAGNOSIS — R2689 Other abnormalities of gait and mobility: Secondary | ICD-10-CM | POA: Diagnosis present

## 2024-01-17 DIAGNOSIS — M6281 Muscle weakness (generalized): Secondary | ICD-10-CM | POA: Diagnosis present

## 2024-01-17 DIAGNOSIS — R29898 Other symptoms and signs involving the musculoskeletal system: Secondary | ICD-10-CM | POA: Diagnosis present
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
## 2024-01-19 ENCOUNTER — Encounter: Payer: Self-pay | Admitting: Family Medicine

## 2024-01-19 ENCOUNTER — Ambulatory Visit (INDEPENDENT_AMBULATORY_CARE_PROVIDER_SITE_OTHER): Admitting: Family Medicine

## 2024-01-19 DIAGNOSIS — Z6841 Body Mass Index (BMI) 40.0 and over, adult: Secondary | ICD-10-CM | POA: Diagnosis not present

## 2024-01-19 DIAGNOSIS — I10 Essential (primary) hypertension: Secondary | ICD-10-CM | POA: Diagnosis not present

## 2024-01-19 DIAGNOSIS — R739 Hyperglycemia, unspecified: Secondary | ICD-10-CM | POA: Diagnosis not present

## 2024-01-19 MED ORDER — TERBINAFINE HCL 250 MG PO TABS: 250.0000 mg | ORAL_TABLET | Freq: Every day | ORAL | 0 refills | Status: DC

## 2024-01-19 MED ORDER — ZEPBOUND 2.5 MG/0.5ML ~~LOC~~ SOAJ: 2.5000 mg | SUBCUTANEOUS | 0 refills | Status: DC

## 2024-01-19 MED ORDER — FLUCONAZOLE 200 MG PO TABS: 200.0000 mg | ORAL_TABLET | ORAL | 0 refills | Status: AC

## 2024-01-19 NOTE — Assessment & Plan Note (Signed)
 BMI  47.85 today.  We discussed lifestyle modifications.  He would be a good candidate for GLP agonist due to his weight and comorbidities including OSA.  He is interested in medical weight management as well.  We discussed potential options.  Will start Zepbound 2.5 mg weekly.  Discussed risk of benefits.  He will follow-up with us  in a few weeks via MyChart and we will adjust as needed.

## 2024-01-19 NOTE — Assessment & Plan Note (Signed)
Check A1c with labs. 

## 2024-01-19 NOTE — Patient Instructions (Addendum)
 It was very nice to see you today!  VISIT SUMMARY: During your visit, we discussed your ongoing knee issues, a persistent rash, and weight management. We also addressed the need for diabetes screening.  YOUR PLAN: OBESITY: Your weight has decreased to 352 lbs, but knee issues are limiting your activity. We discussed using Zepbound for weight loss, cardiovascular protection, and sleep apnea management. -Start taking Zepbound as prescribed for weight management. -Send a message in 3 weeks to discuss dose adjustment. -Aim for a target weight of 290 lbs to be eligible for knee surgery.  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 Previous treatments provided temporary relief. -Take terbinafine daily for 2 weeks -If there is no improvement, we may consider a biopsy or a high potency steroid.  SCREENING FOR DIABETES MELLITUS: Due to your family history and symptoms, we need to screen for diabetes. -Get a fasting blood glucose test. -Schedule a lab appointment for fasting blood work.  Return if symptoms worsen or fail to improve.   Take care, Dr Kennyth  PLEASE NOTE:  If you had any lab tests, please let us  know if you have not heard back within a few days. You may see your results on mychart before we have a chance to review them but we will give you a call once they are reviewed by us .   If we ordered any referrals today, please let us  know if you have not heard from their office within the next week.   If you had any urgent prescriptions sent in today, please check with the pharmacy within an hour of our visit to make sure the prescription was transmitted appropriately.   Please try these tips to maintain a healthy lifestyle:  Eat at least 3 REAL meals and 1-2 snacks per day.  Aim for no more than 5 hours between eating.  If you eat breakfast, please do so within one hour of getting up.   Each meal  should contain half fruits/vegetables, one quarter protein, and one quarter carbs (no bigger than a computer mouse)  Cut down on sweet beverages. This includes juice, soda, and sweet tea.   Drink at least 1 glass of water with each meal and aim for at least 8 glasses per day  Exercise at least 150 minutes every week.

## 2024-01-19 NOTE — Assessment & Plan Note (Signed)
 Blood pressure at goal today on metoprolol  succinate 100 mg twice daily per cardiology.

## 2024-01-19 NOTE — Progress Notes (Signed)
 Clayton Freeman is a 68 y.o. male who presents today for an office visit.  Assessment/Plan:  New/Acute Problems: Rash Gluteal rash is consistent with tinea cruris.  Had a moderate esponse to topical ketoconazole  last year however rash returned after he discontinued this.  Patient does admit the area is hard for him to apply cream to.  We will try terbinafine 250 mg daily for 2 weeks.  He will let us  know if not improving and would consider trial of topical steroid versus biopsy at that time.  He will come back later this week to recheck c-Met though LFTs were normal a few weeks ago.   Chronic Problems Addressed Today: Morbid obesity (HCC) BMI  47.85 today.  We discussed lifestyle modifications.  He would be a good candidate for GLP agonist due to his weight and comorbidities including OSA.  He is interested in medical weight management as well.  We discussed potential options.  Will start Zepbound 2.5 mg weekly.  Discussed risk of benefits.  He will follow-up with us  in a few weeks via MyChart and we will adjust as needed.  Essential (primary) hypertension Blood pressure at goal today on metoprolol  succinate 100 mg twice daily per cardiology.   Hyperglycemia Check A1c with labs     Subjective:  HPI:  See assessment / plan for status of chronic conditions.   Discussed the use of AI scribe software for clinical note transcription with the patient, who gave verbal consent to proceed.  History of Present Illness Clayton Freeman is a 68 year old male who presents with knee issues and a rash.  He has ongoing knee issues, including a torn meniscus. He was told by his knee specialist that he may need a knee replacement at some point, depending on how his condition progresses. 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 He was working out every other day, which was effective until the knee pain increased,  limiting his ability to exercise and impacting his weight loss efforts. He has a history of walking up to three miles a day and achieving 12,000 to 15,000 steps daily before his knee issues worsened.  He has a persistent rash in the groin and buttock area, described as flaky and similar to psoriasis. Previously prescribed ketoconazole  cream initially improved the condition, but the rash returned. Nizoral  shampoo was also used, which helped at first but later irritated the area. The rash is not constantly burning or itching but becomes flaky. He notes that the rash appeared after he had sepsis.  He has lost 15 pounds since May, currently weighing 352 pounds, with a goal to reach 290 pounds. His diet consists of fish or chicken four days a week, avoiding fried foods. He experiences frequent thirst, though he has not had a fasting blood sugar test recently. He uses a CPAP machine for sleep apnea and manages his medications primarily through his cardiologist.         Objective:  Physical Exam: BP 128/72   Pulse 68   Temp 97.9 F (36.6 C) (Temporal)   Ht 6' (1.829 m)   Wt (!) 352 lb 12.8 oz (160 kg)   SpO2 95%   BMI 47.85 kg/m   Wt Readings from Last 3 Encounters:  01/19/24 (!) 352 lb 12.8 oz (160 kg)  01/01/24 (!) 353 lb (160.1 kg)  12/25/23 (!) 355 lb 9.6 oz (161.3 kg)    Gen: No  acute distress, resting comfortably CV: Regular rate and rhythm with no murmurs appreciated Pulm: Normal work of breathing, clear to auscultation bilaterally with no crackles, wheezes, or rhonchi Skin: Large erythematous rash approximately 10 cm in length along gluteal cleft with central maceration noted.  Well-demarcated. Neuro: Grossly normal, moves all extremities Psych: Normal affect and thought content      Clayton Cotham M. Kennyth, MD 01/19/2024 2:40 PM

## 2024-01-20 ENCOUNTER — Telehealth: Payer: Self-pay

## 2024-01-20 ENCOUNTER — Other Ambulatory Visit (HOSPITAL_COMMUNITY): Payer: Self-pay

## 2024-01-20 ENCOUNTER — Encounter: Payer: Self-pay | Admitting: Family Medicine

## 2024-01-20 NOTE — Telephone Encounter (Signed)
 He should start with the terbinafine. We can hold onto the fluconazole as a backup for now but I would like for him to take terbinafine 250 mg twice daily for 2 weeks.

## 2024-01-20 NOTE — Telephone Encounter (Signed)
 Noted

## 2024-01-20 NOTE — Telephone Encounter (Signed)
 Pharmacy Patient Advocate Encounter  Received notification from [facility] that Prior Authorization for Zepbound 2.5MG /0.5ML pen-injectors has been APPROVED from 01/20/24 to 04/14/25. Ran test claim, Copay is $490.47. This test claim was processed through [facility]- copay amounts may vary at other pharmacies due to pharmacy/plan contracts, or as the patient moves through the different stages of their insurance plan.   PA #/Case ID/Reference #: [id number]

## 2024-01-20 NOTE — Telephone Encounter (Signed)
 See note

## 2024-01-20 NOTE — Telephone Encounter (Signed)
 Please start Rx ZEPBOUND  Thanks

## 2024-01-20 NOTE — Telephone Encounter (Signed)
 Pharmacy Patient Advocate Encounter   Received notification from Patient Advice Request messages that prior authorization for Zepbound 2.5MG /0.5ML pen-injectors is required/requested.   Insurance verification completed.   The patient is insured through [facility].   Per test claim: PA required; PA submitted to above mentioned insurance via Latent Key/confirmation #/EOC [id number] Status is pending

## 2024-01-21 NOTE — Telephone Encounter (Signed)
 LVM Rx Zepbound 2.5MG /0.5ML pen-injectors has been APPROVED from 01/20/24 to 04/14/25. If any question please give us  a call

## 2024-01-22 ENCOUNTER — Other Ambulatory Visit: Payer: Self-pay | Admitting: *Deleted

## 2024-01-22 ENCOUNTER — Telehealth: Payer: Self-pay | Admitting: *Deleted

## 2024-01-22 ENCOUNTER — Other Ambulatory Visit

## 2024-01-22 DIAGNOSIS — I10 Essential (primary) hypertension: Secondary | ICD-10-CM

## 2024-01-22 DIAGNOSIS — R739 Hyperglycemia, unspecified: Secondary | ICD-10-CM | POA: Diagnosis not present

## 2024-01-22 LAB — COMPREHENSIVE METABOLIC PANEL WITH GFR
ALT: 24 U/L (ref 0–53)
AST: 15 U/L (ref 0–37)
Albumin: 4.3 g/dL (ref 3.5–5.2)
Alkaline Phosphatase: 52 U/L (ref 39–117)
BUN: 12 mg/dL (ref 6–23)
CO2: 31 meq/L (ref 19–32)
Calcium: 9.3 mg/dL (ref 8.4–10.5)
Chloride: 100 meq/L (ref 96–112)
Creatinine, Ser: 0.73 mg/dL (ref 0.40–1.50)
GFR: 94.38 mL/min (ref >60.00)
Glucose, Bld: 111 mg/dL — ABNORMAL HIGH (ref 70–99)
Potassium: 4.3 meq/L (ref 3.5–5.1)
Sodium: 139 meq/L (ref 135–145)
Total Bilirubin: 0.8 mg/dL (ref 0.2–1.2)
Total Protein: 6.7 g/dL (ref 6.0–8.3)

## 2024-01-22 LAB — HEMOGLOBIN A1C: Hgb A1c MFr Bld: 5.3 % (ref 4.6–6.5)

## 2024-01-22 LAB — LIPID PANEL
Cholesterol: 147 mg/dL (ref 0–200)
HDL: 32.5 mg/dL — ABNORMAL LOW (ref >39.00)
LDL Cholesterol: 70 mg/dL (ref 0–99)
NonHDL: 114.38
Total CHOL/HDL Ratio: 5
Triglycerides: 220 mg/dL — ABNORMAL HIGH (ref 0.0–149.0)
VLDL: 44 mg/dL — ABNORMAL HIGH (ref 0.0–40.0)

## 2024-01-22 LAB — CBC
HCT: 43.3 % (ref 39.0–52.0)
Hemoglobin: 14.8 g/dL (ref 13.0–17.0)
MCHC: 34.2 g/dL (ref 30.0–36.0)
MCV: 96 fl (ref 78.0–100.0)
Platelets: 169 K/uL (ref 150.0–400.0)
RBC: 4.51 Mil/uL (ref 4.22–5.81)
RDW: 13.1 % (ref 11.5–15.5)
WBC: 8.2 K/uL (ref 4.0–10.5)

## 2024-01-22 LAB — TSH: TSH: 3.13 u[IU]/mL (ref 0.35–5.50)

## 2024-01-22 NOTE — Telephone Encounter (Signed)
 LVM to return call.

## 2024-01-22 NOTE — Telephone Encounter (Signed)
 Copied from CRM [id number]. Topic: General - Other >> Jan 22, 2024 12:24 PM Franky GRADE wrote: Reason for CRM: Patient is returning a call he received from [facility] and would like to speak with her some time today if possible.   I LVM with previews message Rx Zepbound approved by insurance  [facility]

## 2024-01-22 NOTE — Telephone Encounter (Signed)
 Please clarify with patient.  The prescription was sent in as terbinafine 250 mg take 1 tablet daily dispense #14.  Our EMR shows this as well.

## 2024-01-22 NOTE — Telephone Encounter (Signed)
**Note De-identified  [place] Obfuscation** Please advise 

## 2024-01-24 ENCOUNTER — Encounter (HOSPITAL_BASED_OUTPATIENT_CLINIC_OR_DEPARTMENT_OTHER): Payer: Self-pay | Admitting: Physical Therapy

## 2024-01-24 ENCOUNTER — Ambulatory Visit (HOSPITAL_BASED_OUTPATIENT_CLINIC_OR_DEPARTMENT_OTHER): Admitting: Physical Therapy

## 2024-01-24 DIAGNOSIS — R2689 Other abnormalities of gait and mobility: Secondary | ICD-10-CM

## 2024-01-24 DIAGNOSIS — M25562 Pain in left knee: Secondary | ICD-10-CM

## 2024-01-24 DIAGNOSIS — M6281 Muscle weakness (generalized): Secondary | ICD-10-CM

## 2024-01-24 DIAGNOSIS — R29898 Other symptoms and signs involving the musculoskeletal system: Secondary | ICD-10-CM
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
## 2024-01-26 ENCOUNTER — Ambulatory Visit: Payer: Self-pay | Admitting: Family Medicine

## 2024-01-26 NOTE — Progress Notes (Signed)
 His labs are all stable compared to last year. HDL is a little low but total cholesterol and LDL are at goal.   All of his other labs are at goal.  Do not need to make any changes to treatment plan.  He should continue to work on diet and exercise and we can recheck again in a year.

## 2024-01-28 ENCOUNTER — Ambulatory Visit (HOSPITAL_BASED_OUTPATIENT_CLINIC_OR_DEPARTMENT_OTHER): Admitting: Physical Therapy

## 2024-01-28 ENCOUNTER — Encounter (HOSPITAL_BASED_OUTPATIENT_CLINIC_OR_DEPARTMENT_OTHER): Payer: Self-pay | Admitting: Physical Therapy

## 2024-01-28 DIAGNOSIS — R2689 Other abnormalities of gait and mobility: Secondary | ICD-10-CM

## 2024-01-28 DIAGNOSIS — M25562 Pain in left knee: Secondary | ICD-10-CM

## 2024-01-28 DIAGNOSIS — M6281 Muscle weakness (generalized): Secondary | ICD-10-CM

## 2024-01-28 DIAGNOSIS — R29898 Other symptoms and signs involving the musculoskeletal system: Secondary | ICD-10-CM
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
## 2024-01-30 ENCOUNTER — Encounter (HOSPITAL_BASED_OUTPATIENT_CLINIC_OR_DEPARTMENT_OTHER): Payer: Self-pay | Admitting: Physical Therapy

## 2024-01-30 ENCOUNTER — Ambulatory Visit (HOSPITAL_BASED_OUTPATIENT_CLINIC_OR_DEPARTMENT_OTHER): Admitting: Physical Therapy

## 2024-01-30 DIAGNOSIS — M6281 Muscle weakness (generalized): Secondary | ICD-10-CM

## 2024-01-30 DIAGNOSIS — R2689 Other abnormalities of gait and mobility: Secondary | ICD-10-CM

## 2024-01-30 DIAGNOSIS — R29898 Other symptoms and signs involving the musculoskeletal system: Secondary | ICD-10-CM

## 2024-01-30 DIAGNOSIS — M25562 Pain in left knee: Secondary | ICD-10-CM | POA: Diagnosis not present
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
## 2024-02-02 ENCOUNTER — Encounter: Payer: Self-pay | Admitting: Family Medicine

## 2024-02-03 ENCOUNTER — Other Ambulatory Visit: Payer: Self-pay | Admitting: *Deleted

## 2024-02-03 MED ORDER — TIRZEPATIDE-WEIGHT MANAGEMENT 5 MG/0.5ML ~~LOC~~ SOAJ: 5.0000 mg | SUBCUTANEOUS | 0 refills | Status: DC

## 2024-02-03 NOTE — Telephone Encounter (Signed)
 See note

## 2024-02-03 NOTE — Telephone Encounter (Signed)
 Please send in new prescription for Zepbound 5 mg weekly.  He should follow-up with us  in a few weeks after starting this.  Okay to refill his terbinafine for another course.  He should let us  know if not improving.

## 2024-02-03 NOTE — Telephone Encounter (Signed)
 Rx was send in on 01/19/2024

## 2024-02-04 ENCOUNTER — Encounter (HOSPITAL_BASED_OUTPATIENT_CLINIC_OR_DEPARTMENT_OTHER): Payer: Self-pay | Admitting: Physical Therapy

## 2024-02-04 ENCOUNTER — Ambulatory Visit (HOSPITAL_BASED_OUTPATIENT_CLINIC_OR_DEPARTMENT_OTHER): Admitting: Physical Therapy

## 2024-02-04 DIAGNOSIS — M6281 Muscle weakness (generalized): Secondary | ICD-10-CM

## 2024-02-04 DIAGNOSIS — R2689 Other abnormalities of gait and mobility: Secondary | ICD-10-CM

## 2024-02-04 DIAGNOSIS — M25562 Pain in left knee: Secondary | ICD-10-CM

## 2024-02-04 DIAGNOSIS — R29898 Other symptoms and signs involving the musculoskeletal system: Secondary | ICD-10-CM
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
## 2024-02-05 NOTE — Telephone Encounter (Signed)
 Yes please see my previous note. Please refill his terbinafine.

## 2024-02-05 NOTE — Telephone Encounter (Signed)
**Note De-identified  [place] Obfuscation** Please advise 

## 2024-02-06 ENCOUNTER — Other Ambulatory Visit: Payer: Self-pay | Admitting: *Deleted

## 2024-02-06 ENCOUNTER — Ambulatory Visit (HOSPITAL_BASED_OUTPATIENT_CLINIC_OR_DEPARTMENT_OTHER): Admitting: Physical Therapy

## 2024-02-06 ENCOUNTER — Encounter (HOSPITAL_BASED_OUTPATIENT_CLINIC_OR_DEPARTMENT_OTHER): Payer: Self-pay | Admitting: Physical Therapy

## 2024-02-06 DIAGNOSIS — M6281 Muscle weakness (generalized): Secondary | ICD-10-CM

## 2024-02-06 DIAGNOSIS — R29898 Other symptoms and signs involving the musculoskeletal system: Secondary | ICD-10-CM

## 2024-02-06 DIAGNOSIS — R2689 Other abnormalities of gait and mobility: Secondary | ICD-10-CM

## 2024-02-06 DIAGNOSIS — M25562 Pain in left knee: Secondary | ICD-10-CM

## 2024-02-06 MED ORDER — TERBINAFINE HCL 250 MG PO TABS: 250.0000 mg | ORAL_TABLET | Freq: Every day | ORAL | 0 refills | Status: AC
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
## 2024-02-10 ENCOUNTER — Encounter (HOSPITAL_BASED_OUTPATIENT_CLINIC_OR_DEPARTMENT_OTHER): Payer: Self-pay | Admitting: Physical Therapy

## 2024-02-10 ENCOUNTER — Ambulatory Visit (HOSPITAL_BASED_OUTPATIENT_CLINIC_OR_DEPARTMENT_OTHER): Admitting: Physical Therapy

## 2024-02-10 DIAGNOSIS — R29898 Other symptoms and signs involving the musculoskeletal system: Secondary | ICD-10-CM

## 2024-02-10 DIAGNOSIS — R2689 Other abnormalities of gait and mobility: Secondary | ICD-10-CM

## 2024-02-10 DIAGNOSIS — M25562 Pain in left knee: Secondary | ICD-10-CM

## 2024-02-10 DIAGNOSIS — M6281 Muscle weakness (generalized): Secondary | ICD-10-CM
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
## 2024-02-13 ENCOUNTER — Ambulatory Visit (HOSPITAL_BASED_OUTPATIENT_CLINIC_OR_DEPARTMENT_OTHER): Admitting: Physical Therapy

## 2024-02-13 ENCOUNTER — Encounter (HOSPITAL_BASED_OUTPATIENT_CLINIC_OR_DEPARTMENT_OTHER): Payer: Self-pay | Admitting: Physical Therapy

## 2024-02-13 DIAGNOSIS — R29898 Other symptoms and signs involving the musculoskeletal system: Secondary | ICD-10-CM

## 2024-02-13 DIAGNOSIS — M25562 Pain in left knee: Secondary | ICD-10-CM

## 2024-02-13 DIAGNOSIS — R2689 Other abnormalities of gait and mobility: Secondary | ICD-10-CM

## 2024-02-13 DIAGNOSIS — M6281 Muscle weakness (generalized): Secondary | ICD-10-CM
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
## 2024-02-17 ENCOUNTER — Ambulatory Visit (HOSPITAL_BASED_OUTPATIENT_CLINIC_OR_DEPARTMENT_OTHER): Attending: Family Medicine | Admitting: Physical Therapy

## 2024-02-17 ENCOUNTER — Encounter (HOSPITAL_BASED_OUTPATIENT_CLINIC_OR_DEPARTMENT_OTHER): Payer: Self-pay | Admitting: Physical Therapy

## 2024-02-17 DIAGNOSIS — M6281 Muscle weakness (generalized): Secondary | ICD-10-CM | POA: Insufficient documentation

## 2024-02-17 DIAGNOSIS — R2689 Other abnormalities of gait and mobility: Secondary | ICD-10-CM | POA: Diagnosis present

## 2024-02-17 DIAGNOSIS — R29898 Other symptoms and signs involving the musculoskeletal system: Secondary | ICD-10-CM | POA: Insufficient documentation

## 2024-02-17 DIAGNOSIS — M25562 Pain in left knee: Secondary | ICD-10-CM | POA: Insufficient documentation
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
## 2024-02-19 ENCOUNTER — Ambulatory Visit (HOSPITAL_BASED_OUTPATIENT_CLINIC_OR_DEPARTMENT_OTHER): Admitting: Physical Therapy

## 2024-02-19 ENCOUNTER — Encounter (HOSPITAL_BASED_OUTPATIENT_CLINIC_OR_DEPARTMENT_OTHER): Payer: Self-pay | Admitting: Physical Therapy

## 2024-02-19 DIAGNOSIS — R29898 Other symptoms and signs involving the musculoskeletal system: Secondary | ICD-10-CM

## 2024-02-19 DIAGNOSIS — M6281 Muscle weakness (generalized): Secondary | ICD-10-CM

## 2024-02-19 DIAGNOSIS — M25562 Pain in left knee: Secondary | ICD-10-CM

## 2024-02-19 DIAGNOSIS — R2689 Other abnormalities of gait and mobility: Secondary | ICD-10-CM
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
## 2024-02-24 ENCOUNTER — Ambulatory Visit (HOSPITAL_BASED_OUTPATIENT_CLINIC_OR_DEPARTMENT_OTHER): Admitting: Physical Therapy

## 2024-02-24 ENCOUNTER — Encounter (HOSPITAL_BASED_OUTPATIENT_CLINIC_OR_DEPARTMENT_OTHER): Payer: Self-pay | Admitting: Physical Therapy

## 2024-02-24 DIAGNOSIS — M6281 Muscle weakness (generalized): Secondary | ICD-10-CM

## 2024-02-24 DIAGNOSIS — R29898 Other symptoms and signs involving the musculoskeletal system: Secondary | ICD-10-CM

## 2024-02-24 DIAGNOSIS — R2689 Other abnormalities of gait and mobility: Secondary | ICD-10-CM

## 2024-02-24 DIAGNOSIS — M25562 Pain in left knee: Secondary | ICD-10-CM
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
## 2024-02-26 ENCOUNTER — Ambulatory Visit (HOSPITAL_BASED_OUTPATIENT_CLINIC_OR_DEPARTMENT_OTHER): Admitting: Physical Therapy

## 2024-02-26 ENCOUNTER — Encounter (HOSPITAL_BASED_OUTPATIENT_CLINIC_OR_DEPARTMENT_OTHER): Payer: Self-pay | Admitting: Physical Therapy

## 2024-02-26 DIAGNOSIS — R2689 Other abnormalities of gait and mobility: Secondary | ICD-10-CM

## 2024-02-26 DIAGNOSIS — R29898 Other symptoms and signs involving the musculoskeletal system: Secondary | ICD-10-CM

## 2024-02-26 DIAGNOSIS — M25562 Pain in left knee: Secondary | ICD-10-CM

## 2024-02-26 DIAGNOSIS — M6281 Muscle weakness (generalized): Secondary | ICD-10-CM
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
## 2024-03-02 ENCOUNTER — Encounter (HOSPITAL_BASED_OUTPATIENT_CLINIC_OR_DEPARTMENT_OTHER): Payer: Self-pay | Admitting: Physical Therapy

## 2024-03-02 ENCOUNTER — Ambulatory Visit (HOSPITAL_BASED_OUTPATIENT_CLINIC_OR_DEPARTMENT_OTHER): Admitting: Physical Therapy

## 2024-03-02 DIAGNOSIS — R2689 Other abnormalities of gait and mobility: Secondary | ICD-10-CM

## 2024-03-02 DIAGNOSIS — M6281 Muscle weakness (generalized): Secondary | ICD-10-CM | POA: Diagnosis not present

## 2024-03-02 DIAGNOSIS — M25562 Pain in left knee: Secondary | ICD-10-CM
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
## 2024-03-04 ENCOUNTER — Encounter (HOSPITAL_BASED_OUTPATIENT_CLINIC_OR_DEPARTMENT_OTHER): Payer: Self-pay | Admitting: Physical Therapy

## 2024-03-04 ENCOUNTER — Ambulatory Visit (HOSPITAL_BASED_OUTPATIENT_CLINIC_OR_DEPARTMENT_OTHER): Admitting: Physical Therapy

## 2024-03-04 DIAGNOSIS — R29898 Other symptoms and signs involving the musculoskeletal system: Secondary | ICD-10-CM

## 2024-03-04 DIAGNOSIS — R2689 Other abnormalities of gait and mobility: Secondary | ICD-10-CM

## 2024-03-04 DIAGNOSIS — M6281 Muscle weakness (generalized): Secondary | ICD-10-CM

## 2024-03-04 DIAGNOSIS — M25562 Pain in left knee: Secondary | ICD-10-CM
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
## 2024-03-08 ENCOUNTER — Ambulatory Visit (HOSPITAL_BASED_OUTPATIENT_CLINIC_OR_DEPARTMENT_OTHER): Admitting: Physical Therapy

## 2024-03-08 ENCOUNTER — Encounter: Payer: Self-pay | Admitting: Family Medicine

## 2024-03-08 ENCOUNTER — Encounter (HOSPITAL_BASED_OUTPATIENT_CLINIC_OR_DEPARTMENT_OTHER): Payer: Self-pay | Admitting: Physical Therapy

## 2024-03-08 ENCOUNTER — Telehealth: Payer: Self-pay

## 2024-03-08 DIAGNOSIS — R29898 Other symptoms and signs involving the musculoskeletal system: Secondary | ICD-10-CM

## 2024-03-08 DIAGNOSIS — M6281 Muscle weakness (generalized): Secondary | ICD-10-CM | POA: Diagnosis not present

## 2024-03-08 DIAGNOSIS — M25562 Pain in left knee: Secondary | ICD-10-CM

## 2024-03-08 DIAGNOSIS — R2689 Other abnormalities of gait and mobility: Secondary | ICD-10-CM

## 2024-03-08 MED ORDER — TIRZEPATIDE-WEIGHT MANAGEMENT 5 MG/0.5ML ~~LOC~~ SOAJ: 5.0000 mg | SUBCUTANEOUS | 0 refills | Status: DC
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
## 2024-03-08 NOTE — Telephone Encounter (Signed)
 Refill request sent per Patient request.

## 2024-03-10 ENCOUNTER — Ambulatory Visit (HOSPITAL_BASED_OUTPATIENT_CLINIC_OR_DEPARTMENT_OTHER): Admitting: Physical Therapy

## 2024-03-10 ENCOUNTER — Encounter (HOSPITAL_BASED_OUTPATIENT_CLINIC_OR_DEPARTMENT_OTHER): Payer: Self-pay | Admitting: Physical Therapy

## 2024-03-10 DIAGNOSIS — R29898 Other symptoms and signs involving the musculoskeletal system: Secondary | ICD-10-CM

## 2024-03-10 DIAGNOSIS — R2689 Other abnormalities of gait and mobility: Secondary | ICD-10-CM

## 2024-03-10 DIAGNOSIS — M25562 Pain in left knee: Secondary | ICD-10-CM

## 2024-03-10 DIAGNOSIS — M6281 Muscle weakness (generalized): Secondary | ICD-10-CM
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
## 2024-03-15 ENCOUNTER — Encounter: Payer: Self-pay | Admitting: Family Medicine

## 2024-03-15 ENCOUNTER — Ambulatory Visit: Admitting: Family Medicine

## 2024-03-15 VITALS — BP 132/88 | HR 76 | Ht 72.0 in | Wt 329.0 lb

## 2024-03-15 DIAGNOSIS — M1712 Unilateral primary osteoarthritis, left knee: Secondary | ICD-10-CM | POA: Diagnosis not present

## 2024-03-15 NOTE — Assessment & Plan Note (Addendum)
 Patient given steroid injection and tolerated the procedure well, discussed icing regimen and home exercises, discussed which activities to do and which ones to avoid.  Increase activity slowly.  Discussed icing regimen.  Follow-up again in 6 to 12 weeks patient has done remarkably well with the weight loss at this time.

## 2024-03-15 NOTE — Patient Instructions (Signed)
 Injection in L knee Drained L knee March 18 can do gel Call us  if you need draining of knee sooner

## 2024-03-17 ENCOUNTER — Ambulatory Visit (HOSPITAL_BASED_OUTPATIENT_CLINIC_OR_DEPARTMENT_OTHER): Admitting: Orthopaedic Surgery

## 2024-03-17 ENCOUNTER — Encounter (HOSPITAL_BASED_OUTPATIENT_CLINIC_OR_DEPARTMENT_OTHER): Admitting: Physical Therapy

## 2024-03-19 ENCOUNTER — Encounter (HOSPITAL_BASED_OUTPATIENT_CLINIC_OR_DEPARTMENT_OTHER): Payer: Self-pay | Admitting: Physical Therapy

## 2024-03-19 ENCOUNTER — Ambulatory Visit (HOSPITAL_BASED_OUTPATIENT_CLINIC_OR_DEPARTMENT_OTHER): Payer: Self-pay | Attending: Family Medicine | Admitting: Physical Therapy

## 2024-03-19 DIAGNOSIS — M6281 Muscle weakness (generalized): Secondary | ICD-10-CM | POA: Insufficient documentation

## 2024-03-19 DIAGNOSIS — R29898 Other symptoms and signs involving the musculoskeletal system: Secondary | ICD-10-CM | POA: Diagnosis present

## 2024-03-19 DIAGNOSIS — R2689 Other abnormalities of gait and mobility: Secondary | ICD-10-CM | POA: Diagnosis present

## 2024-03-19 DIAGNOSIS — M25562 Pain in left knee: Secondary | ICD-10-CM | POA: Insufficient documentation
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
## 2024-03-27 ENCOUNTER — Encounter (HOSPITAL_BASED_OUTPATIENT_CLINIC_OR_DEPARTMENT_OTHER): Payer: Self-pay | Admitting: Physical Therapy

## 2024-03-27 ENCOUNTER — Ambulatory Visit (HOSPITAL_BASED_OUTPATIENT_CLINIC_OR_DEPARTMENT_OTHER): Admitting: Physical Therapy

## 2024-03-27 DIAGNOSIS — M25562 Pain in left knee: Secondary | ICD-10-CM

## 2024-03-27 DIAGNOSIS — M6281 Muscle weakness (generalized): Secondary | ICD-10-CM

## 2024-03-27 DIAGNOSIS — R29898 Other symptoms and signs involving the musculoskeletal system: Secondary | ICD-10-CM

## 2024-03-27 DIAGNOSIS — R2689 Other abnormalities of gait and mobility: Secondary | ICD-10-CM
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
## 2024-03-31 ENCOUNTER — Encounter (HOSPITAL_BASED_OUTPATIENT_CLINIC_OR_DEPARTMENT_OTHER): Payer: Self-pay | Admitting: Physical Therapy

## 2024-03-31 ENCOUNTER — Ambulatory Visit (HOSPITAL_BASED_OUTPATIENT_CLINIC_OR_DEPARTMENT_OTHER): Admitting: Physical Therapy

## 2024-03-31 DIAGNOSIS — M6281 Muscle weakness (generalized): Secondary | ICD-10-CM | POA: Diagnosis not present

## 2024-03-31 DIAGNOSIS — R2689 Other abnormalities of gait and mobility: Secondary | ICD-10-CM

## 2024-03-31 DIAGNOSIS — M25562 Pain in left knee: Secondary | ICD-10-CM

## 2024-03-31 DIAGNOSIS — R29898 Other symptoms and signs involving the musculoskeletal system: Secondary | ICD-10-CM
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
## 2024-04-07 ENCOUNTER — Encounter (HOSPITAL_BASED_OUTPATIENT_CLINIC_OR_DEPARTMENT_OTHER): Admitting: Physical Therapy

## 2024-04-09 ENCOUNTER — Encounter: Payer: Self-pay | Admitting: Family Medicine

## 2024-04-09 ENCOUNTER — Other Ambulatory Visit: Payer: Self-pay

## 2024-04-09 ENCOUNTER — Other Ambulatory Visit: Payer: Self-pay | Admitting: Family Medicine

## 2024-04-09 DIAGNOSIS — R739 Hyperglycemia, unspecified: Secondary | ICD-10-CM

## 2024-04-09 MED ORDER — TIRZEPATIDE-WEIGHT MANAGEMENT 7.5 MG/0.5ML ~~LOC~~ SOLN: 7.5000 mg | SUBCUTANEOUS | 3 refills | Status: AC

## 2024-04-09 NOTE — Telephone Encounter (Signed)
 Spoke with patient. Ok to increase. New prescription sent to pharmacy.

## 2024-04-09 NOTE — Telephone Encounter (Signed)
 If he is tolerating the 5 mg weekly dose well, then we can increase to 7.5 mg weekly. Please send in new prescription if needed.

## 2024-04-09 NOTE — Telephone Encounter (Signed)
 Please review and advise about increase in Zepbound .

## 2024-04-14 ENCOUNTER — Ambulatory Visit (HOSPITAL_BASED_OUTPATIENT_CLINIC_OR_DEPARTMENT_OTHER): Admitting: Physical Therapy

## 2024-05-19 ENCOUNTER — Ambulatory Visit (HOSPITAL_BASED_OUTPATIENT_CLINIC_OR_DEPARTMENT_OTHER): Attending: Family Medicine | Admitting: Physical Therapy

## 2024-05-19 ENCOUNTER — Encounter (HOSPITAL_BASED_OUTPATIENT_CLINIC_OR_DEPARTMENT_OTHER): Payer: Self-pay | Admitting: Physical Therapy

## 2024-05-19 DIAGNOSIS — M25562 Pain in left knee: Secondary | ICD-10-CM

## 2024-05-19 DIAGNOSIS — R29898 Other symptoms and signs involving the musculoskeletal system: Secondary | ICD-10-CM

## 2024-05-19 DIAGNOSIS — M6281 Muscle weakness (generalized): Secondary | ICD-10-CM

## 2024-05-19 DIAGNOSIS — R2689 Other abnormalities of gait and mobility: Secondary | ICD-10-CM
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
## 2024-05-26 ENCOUNTER — Encounter (HOSPITAL_BASED_OUTPATIENT_CLINIC_OR_DEPARTMENT_OTHER): Admitting: Physical Therapy

## 2024-06-03 ENCOUNTER — Encounter (HOSPITAL_BASED_OUTPATIENT_CLINIC_OR_DEPARTMENT_OTHER): Admitting: Physical Therapy

## 2024-06-09 ENCOUNTER — Encounter (HOSPITAL_BASED_OUTPATIENT_CLINIC_OR_DEPARTMENT_OTHER): Admitting: Physical Therapy

## 2024-06-16 ENCOUNTER — Encounter (HOSPITAL_BASED_OUTPATIENT_CLINIC_OR_DEPARTMENT_OTHER): Admitting: Physical Therapy

## 2024-06-21 ENCOUNTER — Ambulatory Visit: Admitting: Cardiology

## 2024-06-24 ENCOUNTER — Encounter (HOSPITAL_BASED_OUTPATIENT_CLINIC_OR_DEPARTMENT_OTHER): Payer: Self-pay | Admitting: Physical Therapy

## 2024-06-29 ENCOUNTER — Encounter (HOSPITAL_BASED_OUTPATIENT_CLINIC_OR_DEPARTMENT_OTHER): Payer: Self-pay | Admitting: Physical Therapy

## 2024-06-30 ENCOUNTER — Ambulatory Visit: Admitting: Family Medicine
